# Patient Record
Sex: Female | Born: 1990 | Race: White | Hispanic: No | Marital: Single | Smoking: Former smoker
Health system: Southern US, Community
[De-identification: ages and names within clinical notes are randomized; demographics above are authoritative.]

## PROBLEM LIST (undated history)

## (undated) DIAGNOSIS — F909 Attention-deficit hyperactivity disorder, unspecified type: Secondary | ICD-10-CM

## (undated) DIAGNOSIS — R011 Cardiac murmur, unspecified: Secondary | ICD-10-CM

## (undated) DIAGNOSIS — D649 Anemia, unspecified: Secondary | ICD-10-CM

## (undated) DIAGNOSIS — F39 Unspecified mood [affective] disorder: Secondary | ICD-10-CM

## (undated) DIAGNOSIS — F329 Major depressive disorder, single episode, unspecified: Secondary | ICD-10-CM

## (undated) DIAGNOSIS — F419 Anxiety disorder, unspecified: Secondary | ICD-10-CM

## (undated) DIAGNOSIS — E079 Disorder of thyroid, unspecified: Secondary | ICD-10-CM

## (undated) DIAGNOSIS — J45909 Unspecified asthma, uncomplicated: Secondary | ICD-10-CM

## (undated) DIAGNOSIS — F32A Depression, unspecified: Secondary | ICD-10-CM

## (undated) DIAGNOSIS — T7840XA Allergy, unspecified, initial encounter: Secondary | ICD-10-CM

## (undated) HISTORY — DX: Allergy, unspecified, initial encounter: T78.40XA

## (undated) HISTORY — PX: GALLBLADDER SURGERY: SHX652

## (undated) HISTORY — DX: Unspecified mood (affective) disorder: F39

## (undated) HISTORY — DX: Unspecified asthma, uncomplicated: J45.909

## (undated) HISTORY — DX: Attention-deficit hyperactivity disorder, unspecified type: F90.9

## (undated) HISTORY — DX: Cardiac murmur, unspecified: R01.1

## (undated) HISTORY — DX: Disorder of thyroid, unspecified: E07.9

## (undated) HISTORY — DX: Anemia, unspecified: D64.9

## (undated) HISTORY — DX: Depression, unspecified: F32.A

## (undated) HISTORY — DX: Anxiety disorder, unspecified: F41.9

## (undated) HISTORY — DX: Major depressive disorder, single episode, unspecified: F32.9

---
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
## 2010-08-01 LAB — LIPASE, BLOOD: Lipase: 22 U/L (ref 11–59)

## 2010-08-01 LAB — DIFFERENTIAL
Basophils Relative: 1 % (ref 0–1)
Eosinophils Relative: 1 % (ref 0–5)
Lymphocytes Relative: 22 % (ref 12–46)
Lymphs Abs: 2.4 10*3/uL (ref 0.7–4.0)
Monocytes Absolute: 0.6 10*3/uL (ref 0.1–1.0)
Monocytes Relative: 5 % (ref 3–12)
Neutro Abs: 8.1 10*3/uL — ABNORMAL HIGH (ref 1.7–7.7)
Neutrophils Relative %: 71 % (ref 43–77)

## 2010-08-01 LAB — URINALYSIS, ROUTINE W REFLEX MICROSCOPIC
Bilirubin Urine: NEGATIVE
Hgb urine dipstick: NEGATIVE
Nitrite: NEGATIVE
Protein, ur: NEGATIVE mg/dL
Specific Gravity, Urine: 1.017 (ref 1.005–1.030)
Urobilinogen, UA: 0.2 mg/dL (ref 0.0–1.0)
pH: 6 (ref 5.0–8.0)

## 2010-08-01 LAB — CBC
Hemoglobin: 11.8 g/dL — ABNORMAL LOW (ref 12.0–15.0)
MCHC: 31.3 g/dL (ref 30.0–36.0)
WBC: 11.3 10*3/uL — ABNORMAL HIGH (ref 4.0–10.5)

## 2010-08-01 LAB — COMPREHENSIVE METABOLIC PANEL
BUN: 8 mg/dL (ref 6–23)
Calcium: 9.5 mg/dL (ref 8.4–10.5)
Chloride: 108 mEq/L (ref 96–112)
Creatinine, Ser: 0.64 mg/dL (ref 0.4–1.2)
GFR calc non Af Amer: >60 mL/min (ref >60)
Total Bilirubin: 0.4 mg/dL (ref 0.3–1.2)

## 2010-08-01 LAB — POCT PREGNANCY, URINE: Preg Test, Ur: NEGATIVE

## 2010-08-17 LAB — URINALYSIS, ROUTINE W REFLEX MICROSCOPIC
Ketones, ur: NEGATIVE mg/dL
Nitrite: NEGATIVE
Protein, ur: NEGATIVE mg/dL
Urobilinogen, UA: 0.2 mg/dL (ref 0.0–1.0)
pH: 6 (ref 5.0–8.0)

## 2010-08-17 LAB — COMPREHENSIVE METABOLIC PANEL
AST: 22 U/L (ref 0–37)
Albumin: 4 g/dL (ref 3.5–5.2)
CO2: 24 mEq/L (ref 19–32)
Calcium: 9.1 mg/dL (ref 8.4–10.5)
GFR calc non Af Amer: >60 mL/min (ref >60)
Sodium: 138 mEq/L (ref 135–145)
Total Bilirubin: 0.3 mg/dL (ref 0.3–1.2)
Total Protein: 7.6 g/dL (ref 6.0–8.3)

## 2010-08-17 LAB — DIFFERENTIAL
Basophils Absolute: 0.1 10*3/uL (ref 0.0–0.1)
Lymphs Abs: 4.5 10*3/uL — ABNORMAL HIGH (ref 0.7–4.0)
Monocytes Relative: 8 % (ref 3–12)
Neutro Abs: 5.8 10*3/uL (ref 1.7–7.7)
Neutrophils Relative %: 51 % (ref 43–77)

## 2010-08-17 LAB — LIPASE, BLOOD: Lipase: 27 U/L (ref 11–59)

## 2010-08-17 LAB — CBC
Hemoglobin: 12.8 g/dL (ref 12.0–15.0)
MCHC: 33.5 g/dL (ref 30.0–36.0)
MCV: 81.7 fL (ref 78.0–100.0)
Platelets: 356 10*3/uL (ref 150–400)
RBC: 4.69 MIL/uL (ref 3.87–5.11)
RDW: 14.7 % (ref 11.5–15.5)

## 2010-08-17 LAB — POCT PREGNANCY, URINE: Preg Test, Ur: NEGATIVE

## 2011-01-04 ENCOUNTER — Emergency Department (HOSPITAL_COMMUNITY)
Admission: EM | Admit: 2011-01-04 | Discharge: 2011-01-04 | Disposition: A | Discharge status: Home / Self Care | Payer: BC Managed Care – PPO | Attending: Emergency Medicine | Admitting: Emergency Medicine

## 2011-01-04 DIAGNOSIS — M25539 Pain in unspecified wrist: Secondary | ICD-10-CM | POA: Insufficient documentation

## 2011-01-04 DIAGNOSIS — M779 Enthesopathy, unspecified: Secondary | ICD-10-CM | POA: Insufficient documentation

## 2011-02-23 LAB — URINALYSIS, ROUTINE W REFLEX MICROSCOPIC
Glucose, UA: NEGATIVE
Ketones, ur: NEGATIVE
Protein, ur: NEGATIVE
Specific Gravity, Urine: 1.03

## 2011-02-23 LAB — CBC
MCHC: 34.1
MCV: 83.4
Platelets: 364 — ABNORMAL HIGH
RDW: 14.6 — ABNORMAL HIGH

## 2011-02-23 LAB — COMPREHENSIVE METABOLIC PANEL
AST: 16
Albumin: 3.2 — ABNORMAL LOW
Alkaline Phosphatase: 62
BUN: 7
Calcium: 7.7 — ABNORMAL LOW
Creatinine, Ser: 0.61
Glucose, Bld: 83
Potassium: 3.7
Total Protein: 6.3

## 2011-02-23 LAB — DIFFERENTIAL
Basophils Absolute: 0.1
Basophils Relative: 1
Eosinophils Absolute: 0.2
Eosinophils Relative: 2
Lymphocytes Relative: 16 — ABNORMAL LOW
Lymphs Abs: 1.6
Monocytes Absolute: 1.3 — ABNORMAL HIGH

## 2011-02-23 LAB — POCT PREGNANCY, URINE
Operator id: 29026
Preg Test, Ur: NEGATIVE

## 2012-06-03 ENCOUNTER — Ambulatory Visit (INDEPENDENT_AMBULATORY_CARE_PROVIDER_SITE_OTHER): Payer: BC Managed Care – PPO | Admitting: Family Medicine

## 2012-06-03 VITALS — BP 136/83 | HR 111 | Temp 98.0°F | Resp 18 | Ht 64.5 in | Wt 201.0 lb

## 2012-06-03 DIAGNOSIS — L0291 Cutaneous abscess, unspecified: Secondary | ICD-10-CM

## 2012-06-03 DIAGNOSIS — T300 Burn of unspecified body region, unspecified degree: Secondary | ICD-10-CM

## 2012-06-03 MED ORDER — DOXYCYCLINE HYCLATE 100 MG PO TABS: 100.0000 mg | ORAL_TABLET | Freq: Two times a day (BID) | ORAL | Status: DC

## 2012-06-03 MED ORDER — SILVER SULFADIAZINE 1 % EX CREA: TOPICAL_CREAM | Freq: Every day | CUTANEOUS | Status: DC

## 2012-06-03 NOTE — Progress Notes (Signed)
[facility]:  Office Visit  Chief Complaint:  Chief Complaint  Patient presents with  . burn arm    1 week ago  . abcess shoulder    right shoulder notice it about a week    HPI: Kim Lynch is a 22 y.o. female who complains of: 1. Right shoulder -pimple started about 1.5 weeks ago, looked like it was ready to pop and she squeezed it  and then 1 day after that got hard, painful. Tried  warm water during shower and green pus would come out and today it looks worse. Draining greenish yellow pus. 2. Burn at work, occurred 1 week ago. Works at [employer]. Burn herself on steamer for quesadillas. Using neosporin for both burn and abscess with dry dressing   Past Medical History  Diagnosis Date  . Allergy   . Asthma   . Depression   . Anemia   . Anxiety   . Heart murmur   . Mood disorder   . ADHD (attention deficit hyperactivity disorder)    Past Surgical History  Procedure Date  . Gallbladder surgery    History   Social History  . Marital Status: Single    Spouse Name: N/A    Number of Children: N/A  . Years of Education: N/A   Social History Main Topics  . Smoking status: Never Smoker   . Smokeless tobacco: None  . Alcohol Use: No  . Drug Use: None  . Sexually Active: None   Other Topics Concern  . None   Social History Narrative  . None   Family History  Problem Relation Age of Onset  . Acute myelogenous leukemia Mother   . Depression Father   . Bipolar disorder Father   . Anxiety disorder Brother   . Lupus Maternal Grandmother   . Stroke Maternal Grandfather   . Lupus Paternal Grandmother    No Known Allergies Prior to Admission medications   Medication Sig Start Date End Date Taking? Authorizing Provider  amphetamine-dextroamphetamine (ADDERALL XR) 30 MG 24 hr capsule Take 30 mg by mouth every morning.   Yes Historical Provider, MD  citalopram (CELEXA) 20 MG tablet Take 20 mg by mouth daily.   Yes Historical  Provider, MD  lamoTRIgine (LAMICTAL) 100 MG tablet Take 100 mg by mouth daily.   Yes Historical Provider, MD     ROS: The patient denies fevers, chills, night sweats, unintentional weight loss, chest pain, palpitations, wheezing, dyspnea on exertion, nausea, vomiting, abdominal pain, dysuria, hematuria, melena, numbness, weakness, or tingling.  All other systems have been reviewed and were otherwise negative with the exception of those mentioned in the HPI and as above.    PHYSICAL EXAM: Filed Vitals:   06/03/12 1852  BP: 136/83  Pulse: 111  Temp: 98 F (36.7 C)  Resp: 18   Filed Vitals:   06/03/12 1852  Height: 5' 4.5" (1.638 m)  Weight: 201 lb (91.173 kg)   Body mass index is 33.97 kg/(m^2).  General: Alert, no acute distress, obese white female, pleasant HEENT:  Normocephalic, atraumatic, oropharynx patent.  Cardiovascular:  Regular rate and rhythm, no rubs murmurs or gallops.  No Carotid bruits, radial pulse intact. No pedal edema.  Respiratory: Clear to auscultation bilaterally.  No wheezes, rales, or rhonchi.  No cyanosis, no use of accessory musculature GI: No organomegaly, abdomen is soft and non-tender, positive bowel sounds.  No masses. Skin: Right posterior shoulder: + 1/2 inch by 1/2 inch abscess, minimally  fluctuant, draining serosainguinous fluid. Min tenderness; Left upper forearm healing pink macular burn area about 3x2 inches Neurologic: Facial musculature symmetric. Psychiatric: Patient is appropriate throughout our interaction. Lymphatic: No cervical lymphadenopathy Musculoskeletal: Gait intact.   LABS:    EKG/XRAY:   Primary read interpreted by Dr. Conley Rolls at [facility].   ASSESSMENT/PLAN: Encounter Diagnoses  Name Primary?  . Cellulitis and abscess Yes  . Burn    Rx Doxycycline for right posterior shoulder abscess Rx  Silvadene for left arm burn Warm compresses to shoulder, C/w dry dressing F/u in 3-4 days if no improvement for I&D. Currently she would  prefer medications since she does not like getting shots.      Hamilton Capri PHUONG, DO 06/03/2012 7:34 PM

## 2012-08-02 ENCOUNTER — Ambulatory Visit (INDEPENDENT_AMBULATORY_CARE_PROVIDER_SITE_OTHER): Payer: BC Managed Care – PPO | Admitting: Family Medicine

## 2012-08-02 VITALS — BP 108/75 | HR 91 | Temp 98.2°F | Resp 16 | Ht 66.5 in | Wt 210.8 lb

## 2012-08-02 DIAGNOSIS — J019 Acute sinusitis, unspecified: Secondary | ICD-10-CM

## 2012-08-02 DIAGNOSIS — J209 Acute bronchitis, unspecified: Secondary | ICD-10-CM

## 2012-08-02 MED ORDER — AZITHROMYCIN 250 MG PO TABS: ORAL_TABLET | ORAL | Status: DC

## 2012-08-02 MED ORDER — METHYLPREDNISOLONE 4 MG PO KIT: PACK | ORAL | Status: DC

## 2012-08-02 NOTE — Progress Notes (Signed)
Patient ID: Kim Lynch MRN: [id number], DOB: [birth year], 22 y.o. Date of Encounter: 08/02/2012, 10:26 AM  Primary Physician: No primary provider on file.  Chief Complaint:  Chief Complaint  Patient presents with  . Cough  . Bodyaches  . hit forehead    hit on cupboard door    HPI: 22 y.o. year old female presents with a 30 day history of nasal congestion, post nasal drip, sore throat, and cough. Mild sinus pressure. Afebrile. No chills. Nasal congestion thick and green/yellow. Cough is productive of green/yellow sputum and not associated with time of day. Ears feel full, leading to sensation of muffled hearing. Has tried OTC cold preps without success. No GI complaints. Appetite poor  No sick contacts, recent antibiotics, or recent travels.   No leg trauma, sedentary periods, h/o cancer, or tobacco use.  Past Medical History  Diagnosis Date  . Allergy   . Asthma   . Depression   . Anemia   . Anxiety   . Heart murmur   . Mood disorder   . ADHD (attention deficit hyperactivity disorder)      Home Meds: Prior to Admission medications   Medication Sig Start Date End Date Taking? Authorizing Provider  amphetamine-dextroamphetamine (ADDERALL XR) 30 MG 24 hr capsule Take 30 mg by mouth every morning.   Yes Historical Provider, MD  citalopram (CELEXA) 20 MG tablet Take 20 mg by mouth daily.   Yes Historical Provider, MD  lamoTRIgine (LAMICTAL) 100 MG tablet Take 100 mg by mouth daily.   Yes Historical Provider, MD  loratadine (CLARITIN) 10 MG tablet Take 10 mg by mouth daily.   Yes Historical Provider, MD  doxycycline (VIBRA-TABS) 100 MG tablet Take 1 tablet (100 mg total) by mouth 2 (two) times daily. 06/03/12   Thao P Le, DO  silver sulfADIAZINE (SILVADENE) 1 % cream Apply topically daily. 06/03/12   Thao P Le, DO    Allergies: No Known Allergies  History   Social History  . Marital Status: Single    Spouse Name: N/A    Number of Children: N/A  . Years of  Education: N/A   Occupational History  . Not on file.   Social History Main Topics  . Smoking status: Former Games developer  . Smokeless tobacco: Not on file  . Alcohol Use: No  . Drug Use: Not on file  . Sexually Active: Not on file   Other Topics Concern  . Not on file   Social History Narrative  . No narrative on file     Review of Systems: Constitutional: negative for chills, fever, night sweats or weight changes Cardiovascular: negative for chest pain or palpitations Respiratory: negative for hemoptysis, wheezing, or shortness of breath Abdominal: negative for abdominal pain, nausea, vomiting or diarrhea Dermatological: negative for rash Neurologic: negative for headache   Physical Exam: Blood pressure 108/75, pulse 91, temperature 98.2 F (36.8 C), temperature source Oral, resp. rate 16, height 5' 6.5" (1.689 m), weight 210 lb 12.8 oz (95.618 kg), last menstrual period 07/06/2012, SpO2 99.00%., Body mass index is 33.52 kg/(m^2). General: Well developed, well nourished, in no acute distress. Head: Normocephalic, atraumatic, eyes without discharge, sclera non-icteric, nares are congested. Bilateral auditory canals clear, TM's are without perforation, red and slightly bulging on right. No sinus TTP. Oral cavity moist, dentition normal. Posterior pharynx with post nasal drip and mild erythema. No peritonsillar abscess or tonsillar exudate. Neck: Supple. No thyromegaly. Full ROM. No lymphadenopathy. Lungs: Bilateral wheezes and inspiratory rales Heart:  RRR with S1 S2. No murmurs, rubs, or gallops appreciated. Msk:  Strength and tone normal for age. Extremities: No clubbing or cyanosis. No edema. Neuro: Alert and oriented X 3. Moves all extremities spontaneously. CNII-XII grossly in tact. Psych:  Responds to questions appropriately with a normal affect.    ASSESSMENT AND PLAN:  22 y.o. year old female with bronchitis and sinusitis  - -Mucinex -Tylenol/Motrin  prn -Rest/fluids -RTC precautions -RTC 3-5 days if no improvement  Signed, Elvina Sidle, MD 08/02/2012 10:26 AM

## 2012-08-02 NOTE — Patient Instructions (Signed)

## 2014-08-09 ENCOUNTER — Ambulatory Visit (INDEPENDENT_AMBULATORY_CARE_PROVIDER_SITE_OTHER): Payer: BC Managed Care – PPO | Admitting: Internal Medicine

## 2014-08-09 VITALS — BP 124/70 | HR 85 | Temp 99.3°F | Resp 18 | Ht 68.0 in | Wt 217.6 lb

## 2014-08-09 DIAGNOSIS — R112 Nausea with vomiting, unspecified: Secondary | ICD-10-CM | POA: Diagnosis not present

## 2014-08-09 MED ORDER — ONDANSETRON 4 MG PO TBDP
8.0000 mg | ORAL_TABLET | Freq: Once | ORAL | Status: AC
  Administered 2014-08-09: 8 mg via ORAL

## 2014-08-09 MED ORDER — ONDANSETRON HCL 4 MG PO TABS: 4.0000 mg | ORAL_TABLET | Freq: Three times a day (TID) | ORAL | Status: DC | PRN

## 2014-08-09 NOTE — Progress Notes (Signed)
This chart was scribed for Kim Sia, MD by Kim Lynch, Medical Scribe. This patient was seen in room 5 and the patient's care was started at 4:50 PM.  Subjective:    Patient ID: Kim Lynch, female    DOB: November 28, 1990, 24 y.o.   MRN: [id number]  Chief Complaint  Patient presents with  . Emesis  . Diarrhea    HPI Kim Lynch is a 24 y.o. female who presents to the office complaining of several episodes of emesis that started 4 days ago. Last episode of emesis was this morning at 2AM. She is also complaining of associated diarrhea, nausea, abdominal cramping, chills, and generalized myalgias. Pt reports a h/o cholecystectomy. Last episode of diarrhea was an hour prior to arrival. Pt denies fever, sore throat, visual disturbance, CP, cough, SOB, urinary symptoms, or HA as associated symptoms.  There are no active problems to display for this patient.  Past Medical History  Diagnosis Date  . Allergy   . Asthma   . Depression   . Anemia   . Anxiety   . Heart murmur   . Mood disorder   . ADHD (attention deficit hyperactivity disorder)    Past Surgical History  Procedure Laterality Date  . Gallbladder surgery     No Known Allergies Prior to Admission medications   Medication Sig Start Date End Date Taking? Authorizing Provider  amphetamine-dextroamphetamine (ADDERALL XR) 30 MG 24 hr capsule Take 30 mg by mouth every morning.   Yes Historical Provider, MD  citalopram (CELEXA) 20 MG tablet Take 20 mg by mouth daily.   Yes Historical Provider, MD  lamoTRIgine (LAMICTAL) 100 MG tablet Take 100 mg by mouth daily.   Yes Historical Provider, MD  azithromycin (ZITHROMAX Z-PAK) 250 MG tablet Take as directed on pack Patient not taking: Reported on 08/09/2014 08/02/12   Elvina Sidle, MD  loratadine (CLARITIN) 10 MG tablet Take 10 mg by mouth daily.    Historical Provider, MD  methylPREDNISolone (MEDROL, PAK,) 4 MG tablet follow package directions Patient not taking:  Reported on 08/09/2014 08/02/12   Elvina Sidle, MD  silver sulfADIAZINE (SILVADENE) 1 % cream Apply topically daily. Patient not taking: Reported on 08/09/2014 06/03/12   Lenell Antu, DO   History   Social History  . Marital Status: Single    Spouse Name: N/A  . Number of Children: N/A  . Years of Education: N/A   Occupational History  . Not on file.   Social History Main Topics  . Smoking status: Former Games developer  . Smokeless tobacco: Not on file  . Alcohol Use: No  . Drug Use: Not on file  . Sexual Activity: Not on file   Other Topics Concern  . Not on file   Social History Narrative     Review of Systems  Constitutional: Positive for chills. Negative for fever.  Respiratory: Negative for cough and shortness of breath.   Cardiovascular: Negative for chest pain.  Gastrointestinal: Positive for nausea, vomiting, abdominal pain and diarrhea. Negative for blood in stool.  Musculoskeletal: Positive for myalgias.  Neurological: Negative for dizziness, light-headedness and headaches.      BP 124/70 mmHg  Pulse 85  Temp(Src) 99.3 F (37.4 C) (Oral)  Resp 18  Ht  (1.727 m)  Wt 217 lb 9.6 oz (98.703 kg)  BMI 33.09 kg/m2  Objective:   Physical Exam  Constitutional: She appears well-developed and well-nourished. No distress.  HENT:  Head: Normocephalic and atraumatic.  Right Ear: External ear  normal.  Left Ear: External ear normal.  Eyes: Conjunctivae and EOM are normal. Pupils are equal, round, and reactive to light. Right eye exhibits no discharge. Left eye exhibits no discharge.  Neck: Neck supple.  Cardiovascular: Normal rate, regular rhythm and normal heart sounds.  Exam reveals no gallop and no friction rub.   No murmur heard. Pulmonary/Chest: Effort normal and breath sounds normal. No respiratory distress.  Abdominal: Soft. Bowel sounds are normal. She exhibits no distension. There is no tenderness.  Muscle wall is tender but there is no organomegaly, no  masses, or rebound.   Musculoskeletal: She exhibits no edema or tenderness.  Neurological: She is alert.  Skin: Skin is warm and dry.  Psychiatric: She has a normal mood and affect. Her behavior is normal. Thought content normal.  Nursing note and vitals reviewed.   Assessment & Plan:  Non-intractable vomiting with nausea, vomiting of unspecified type - Plan: ondansetron (ZOFRAN-ODT) disintegrating tablet 8 mg  gastroenteritis suspected Meds ordered this encounter  Medications  . ondansetron (ZOFRAN-ODT) disintegrating tablet 8 mg given in clinic    Sig:   . ondansetron (ZOFRAN) 4 MG tablet    Sig: Take 1 tablet (4 mg total) by mouth every 8 (eight) hours as needed for nausea or vomiting.    Dispense:  10 tablet    Refill:  0   one fourth class of ice chips and liquid every 15 minutes for 2 hours and then advance diet as tolerated   I have completed the patient encounter in its entirety as documented by the scribe, with editing by me where necessary. Kim Lynch, M.D.

## 2015-08-17 ENCOUNTER — Ambulatory Visit: Payer: BC Managed Care – PPO | Admitting: Internal Medicine

## 2015-08-17 DIAGNOSIS — Z0289 Encounter for other administrative examinations: Secondary | ICD-10-CM

## 2016-10-22 ENCOUNTER — Encounter: Payer: Self-pay | Admitting: Nurse Practitioner

## 2016-10-22 ENCOUNTER — Ambulatory Visit (INDEPENDENT_AMBULATORY_CARE_PROVIDER_SITE_OTHER): Payer: Self-pay | Admitting: Nurse Practitioner

## 2016-10-22 VITALS — BP 116/70 | HR 69 | Temp 99.4°F | Wt 243.2 lb

## 2016-10-22 DIAGNOSIS — J069 Acute upper respiratory infection, unspecified: Secondary | ICD-10-CM

## 2016-10-22 DIAGNOSIS — M791 Myalgia, unspecified site: Secondary | ICD-10-CM

## 2016-10-22 LAB — POC INFLUENZA A&B (BINAX/QUICKVUE)
INFLUENZA A, POC: NEGATIVE
Influenza B, POC: NEGATIVE

## 2016-10-22 MED ORDER — AZITHROMYCIN 250 MG PO TABS: ORAL_TABLET | ORAL | 0 refills | Status: AC

## 2016-10-22 MED ORDER — BENZONATATE 100 MG PO CAPS: 100.0000 mg | ORAL_CAPSULE | Freq: Two times a day (BID) | ORAL | 0 refills | Status: AC

## 2016-10-22 NOTE — Addendum Note (Signed)
Addended by: Violeta GelinasWYRICK, Sokha Craker D on: 10/22/2016 11:26 AM   Modules accepted: Orders

## 2016-10-22 NOTE — Patient Instructions (Addendum)
Upper Respiratory Infection, Adult Most upper respiratory infections (URIs) are a viral infection of the air passages leading to the lungs. A URI affects the nose, throat, and upper air passages. The most common type of URI is nasopharyngitis and is typically referred to as "the common cold." URIs run their course and usually go away on their own. Most of the time, a URI does not require medical attention, but sometimes a bacterial infection in the upper airways can follow a viral infection. This is called a secondary infection. Sinus and middle ear infections are common types of secondary upper respiratory infections. Bacterial pneumonia can also complicate a URI. A URI can worsen asthma and chronic obstructive pulmonary disease (COPD). Sometimes, these complications can require emergency medical care and may be life threatening. What are the causes? Almost all URIs are caused by viruses. A virus is a type of germ and can spread from one person to another. What increases the risk? You may be at risk for a URI if:  You smoke.  You have chronic heart or lung disease.  You have a weakened defense (immune) system.  You are very young or very old.  You have nasal allergies or asthma.  You work in crowded or poorly ventilated areas.  You work in health care facilities or schools.  What are the signs or symptoms? Symptoms typically develop 2-3 days after you come in contact with a cold virus. Most viral URIs last 7-10 days. However, viral URIs from the influenza virus (flu virus) can last 14-18 days and are typically more severe. Symptoms may include:  Runny or stuffy (congested) nose.  Sneezing.  Cough.  Sore throat.  Headache.  Fatigue.  Fever.  Loss of appetite.  Pain in your forehead, behind your eyes, and over your cheekbones (sinus pain).  Muscle aches.  How is this diagnosed? Your health care provider may diagnose a URI by:  Physical exam.  Tests to check that your  symptoms are not due to another condition such as: ? Strep throat. ? Sinusitis. ? Pneumonia. ? Asthma.  How is this treated? A URI goes away on its own with time. It cannot be cured with medicines, but medicines may be prescribed or recommended to relieve symptoms. Medicines may help:  Reduce your fever.  Reduce your cough.  Relieve nasal congestion.  Follow these instructions at home:  Take medicines only as directed by your health care provider.  Gargle warm saltwater or take cough drops to comfort your throat as directed by your health care provider.  Use a warm mist humidifier or inhale steam from a shower to increase air moisture. This may make it easier to breathe.  Drink enough fluid to keep your urine clear or pale yellow.  Eat soups and other clear broths and maintain good nutrition.  Rest as needed.  Return to work when your temperature has returned to normal or as your health care provider advises. You may need to stay home longer to avoid infecting others. You can also use a face mask and careful hand washing to prevent spread of the virus.  Increase the usage of your inhaler if you have asthma.  Do not use any tobacco products, including cigarettes, chewing tobacco, or electronic cigarettes. If you need help quitting, ask your health care provider. How is this prevented? The best way to protect yourself from getting a cold is to practice good hygiene.  Avoid oral or hand contact with people with cold symptoms.  Wash your   hands often if contact occurs.  There is no clear evidence that vitamin C, vitamin E, echinacea, or exercise reduces the chance of developing a cold. However, it is always recommended to get plenty of rest, exercise, and practice good nutrition. Contact a health care provider if:  You are getting worse rather than better.  Your symptoms are not controlled by medicine.  You have chills.  You have worsening shortness of breath.  You have  brown or red mucus.  You have yellow or brown nasal discharge.  You have pain in your face, especially when you bend forward.  You have a fever.  You have swollen neck glands.  You have pain while swallowing.  You have white areas in the back of your throat. Get help right away if:  You have severe or persistent: ? Headache. ? Ear pain. ? Sinus pain. ? Chest pain.  You have chronic lung disease and any of the following: ? Wheezing. ? Prolonged cough. ? Coughing up blood. ? A change in your usual mucus.  You have a stiff neck.  You have changes in your: ? Vision. ? Hearing. ? Thinking. ? Mood. This information is not intended to replace advice given to you by your health care provider. Make sure you discuss any questions you have with your health care provider. Document Released: 10/24/2000 Document Revised: 01/01/2016 Document Reviewed: 08/05/2013 Elsevier Interactive Patient Education  2017 [employer] Inc[employer]  

## 2016-10-22 NOTE — Progress Notes (Signed)
Subjective:     Kim Lynch is a 26 y.o. female who presents for evaluation of fever. She has had the fever for 3 days. Symptoms have been gradually worsening. Symptoms are described as suspected fevers but not measured at home, and are worse in the morning and at nighttime. Associated symptoms are poor appetite and sneezing and some mild fatigue.  Patient also coughing x 1 week, but worsening over the past 3 days.  Cough is productive with greenish sputum. . Patient denies body aches, diarrhea and vomiting.  She has tried to alleviate the symptoms with has not taken any medications for her symptoms. with no relief. 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  Patient has a history of bronchitis.  The following portions of the patient's history were reviewed and updated as appropriate: allergies, current medications and past medical history.  Review of Systems Constitutional: positive for anorexia, chills, fatigue and fevers Eyes: positive for itching Ears, nose, mouth, throat, and face: positive for nasal congestion and sore throat, negative for ear drainage and earaches Respiratory: positive for cough Cardiovascular: negative Allergic/Immunologic: positive for hay fever   Objective:    BP 116/70 (BP Location: Right Arm, Patient Position: Sitting, Cuff Size: Normal)   Pulse 69   Temp 99.4 F (37.4 C) (Oral)   Wt 243 lb 3.2 oz (110.3 kg)   SpO2 100%   BMI 36.98 kg/m  General appearance: alert, cooperative and no distress Head: Normocephalic, without obvious abnormality, atraumatic Eyes: conjunctivae/corneas clear. PERRL, EOM's intact. Fundi benign. Ears: normal TM's and external ear canals both ears Nose: mild maxillary sinus tenderness bilateral Throat: mild tonsillar erythema bilaterally, mild edema bilaterally Lungs: clear to auscultation bilaterally Heart: regular rate and rhythm, S1, S2 normal, no murmur, click, rub or gallop Neurologic:  Grossly normal   Assessment:    Fever is likely secondary to Acute Upper Respiratory Infection.   Plan:    Antibiotics as per orders. Ibuprofen or Tylenol for pain, fever, or general discomfort. Tessalon Perles, Throat lozenges or cough drops for the cough.  Go to ER if increasing fever, worsening cough, SOB or difficulty breathing. Follow up as needed.

## 2016-10-25 ENCOUNTER — Telehealth: Payer: Self-pay | Admitting: Nurse Practitioner

## 2016-10-25 NOTE — Telephone Encounter (Signed)
Called patient to follow up on her status.  Reached voicemail, left message to return my call.

## 2017-10-30 ENCOUNTER — Other Ambulatory Visit: Payer: Self-pay

## 2017-10-30 ENCOUNTER — Encounter: Payer: Self-pay | Admitting: Physician Assistant

## 2017-10-30 ENCOUNTER — Ambulatory Visit: Payer: Self-pay | Admitting: Physician Assistant

## 2017-10-30 VITALS — BP 108/70 | HR 84 | Temp 98.6°F | Resp 16 | Ht 65.0 in | Wt 242.6 lb

## 2017-10-30 DIAGNOSIS — E039 Hypothyroidism, unspecified: Secondary | ICD-10-CM | POA: Insufficient documentation

## 2017-10-30 DIAGNOSIS — Z8639 Personal history of other endocrine, nutritional and metabolic disease: Secondary | ICD-10-CM

## 2017-10-30 DIAGNOSIS — F32A Depression, unspecified: Secondary | ICD-10-CM | POA: Insufficient documentation

## 2017-10-30 DIAGNOSIS — Z6841 Body Mass Index (BMI) 40.0 and over, adult: Secondary | ICD-10-CM | POA: Insufficient documentation

## 2017-10-30 DIAGNOSIS — F329 Major depressive disorder, single episode, unspecified: Secondary | ICD-10-CM

## 2017-10-30 DIAGNOSIS — E66813 Obesity, class 3: Secondary | ICD-10-CM | POA: Insufficient documentation

## 2017-10-30 MED ORDER — LEVOTHYROXINE SODIUM 50 MCG PO TABS: 50.0000 ug | ORAL_TABLET | Freq: Every day | ORAL | 11 refills | Status: DC

## 2017-10-30 MED ORDER — CITALOPRAM HYDROBROMIDE 40 MG PO TABS: 40.0000 mg | ORAL_TABLET | Freq: Every day | ORAL | 5 refills | Status: DC

## 2017-10-30 NOTE — Patient Instructions (Addendum)
I will contact you with the results of today's labs when they come back and let you know what the follow-up plan will be.  Please read below for tips about maintaining a healthy lifestyle.   These are some of my general health and wellness recommendations. Some of them may apply to you better than others. Please use common sense as you try these suggestions and feel free to ask me any questions.  ACTIVITY/FITNESS Mental, social, emotional and physical stimulation are very important for brain and body health. Try learning a new activity (arts, music, language, sports, games).  Keep moving your body to the best of your abilities. You can do this at home, inside or outside, the park, community center, gym or anywhere you like. Consider a physical therapist or personal trainer to get started. Consider the app Sworkit. Fitness trackers such as smart-watches, smart-phones or Fitbits can help as well.   NUTRITION Eat more plants: colorful vegetables, nuts, seeds and berries. Eat less sugar, salt, preservatives and processed foods. Avoid toxins such as cigarettes and alcohol. Drink water when you are thirsty. Warm water with a slice of lemon is an excellent morning drink to start the day. Consider these websites for more information The Nutrition Source (https://www.henry-hernandez.biz/) Precision Nutrition (WindowBlog.ch)   RELAXATION Consider practicing mindfulness meditation or other relaxation techniques such as deep breathing, prayer, yoga, tai chi, massage. See website mindful.org or the apps Headspace or Calm to help get started.   SLEEP Try to get at least 7-8+ hours sleep per day. Regular exercise and reduced caffeine will help you sleep better. Practice good sleep hygeine techniques. See website sleep.org for more information.   Try to shop mostly along the perimeter of the grocery store. Cut down consumption of processed foods.    The following foods are the foundation of a heart-healthy diet: Vegetables such as greens (spinach, collard greens, kale), broccoli, cabbage, carrots, bell peppers; stay away from starchy vegetables like potatoes, carrots, peas Fruits such as avocados, apples, berries, bananas, oranges, pears, grapes, and prunes  Whole grains such as plain oatmeal, brown rice, and whole-grain bread or tortillas  Fat-free or low-fat dairy foods such as milk, cheese, or yogurt  Protein-rich foods:  Fish high in omega-3 fatty acids, such as salmon, tuna, and trout, about 8 ounces a week  Lean meats such as 95 percent lean ground beef or pork tenderloin  Poultry such as skinless chicken or [place]  Eggs  Nuts, seeds, and soy products: quinoa, chia seeds Legumes such as kidney beans, lentils, chickpeas, black-eyed peas, and lima beans Oils and foods containing high levels of monounsaturated and polyunsaturated fats that can help lower blood cholesterol levels and the risk of cardiovascular disease. Some sources of these oils are:  Canola, corn, olive, safflower, sesame, sunflower, and soybean oils  Nuts such as walnuts, almonds, and pine nuts  Nut and seed butters  Salmon and trout  Seeds such as sesame, sunflower, pumpkin, or flax  Avocados  Tofu  Brussel sprouts - Cut off stems. Place in a mixing bowl that has a lid. Pour in a 1/4-1/2 cup olive oil, spices, use a light amount of parmesan. Place on a baking sheet. Bake for 10 minutes at 400F. Take it out, eat the brussel chips. Place for another 5-10 minutes.   Mashed cauliflower - Boil a bunch of cauliflower in a pot of water. Blend in a food processor with 1-2 tablespoons of butter.  Spaghetti squash -  Cut the squash in half  very carefully, clean out seeds from the middle. Place 1/2 face down in a microwave safe dish with at least 2 inches of water. Make 4-6 slits on outside of spaghetti squash and microwave for 10-12 minutes. Take out the spaghetti using  a metal spoon. Repeat for the other half.   Vega protein is good protein powder, make sure you use ~6 ice cubes to give it smoothie consistency together with ~4-6 ounces of vanilla soy milk. Throw cinnamon into your shake, use peanut butter. You can also use the fruits listed above. Throw spinach or kale into the shake.   Recipe ideas: [employer], [employer], Lung, and Deportcom, wholefoodsmarket.com  Limit added sugars When you follow a heart-healthy eating plan, you should limit the amount of calories you consume each day from added sugars. Because added sugars do not provide essential nutrients and are extra calories, limiting them can help you choose nutrient-rich foods and stay within your daily calorie limit. Some foods, such as fruit, contain natural sugars. Added sugars do not occur naturally in foods, but instead are used to sweeten foods and drinks. Some examples of added sugars include brown sugar, corn syrup, dextrose, fructose, glucose, high-fructose corn syrup, raw sugar, and sucrose. In the [place], sweetened drinks, snacks, and sweets are the major sources of added sugars. Sweetened drinks account for about half of all added sugars consumed. The following are examples of foods and drinks with added sugars. Sweetened drinks include soft drinks or sodas, fruit drinks, sweetened coffee and tea, energy drinks, alcoholic drinks, and favored waters.  Snacks and sweets include grain-based desserts such as cakes, pies, cookies, brownies, doughnuts; dairy desserts such as ice cream, frozen desserts, and pudding; candies; sugars; jams; syrups; and sweet toppings. To help you reduce the amount of added sugars in your diet: Choose unsweetened or whole fruits for snacks or dessert.  Choose drinks without added sugar such as water, low-fat or fat-free milk, or 100 percent fruit or vegetable juice.  Limit intake of sweetened drinks, snacks and desserts by  eating them less often and in smaller amounts.  If you drink alcohol, you should limit your intake. Men should have no more than two alcoholic drinks per day. Women should have no more than one alcoholic drink per day. One drink is: 12 ounces of regular beer (5 percent alcohol)  5 ounces of wine (12 percent alcohol)  1 ounces of 80-proof liquor (40 percent alcohol)   Thank you for coming in today. I hope you feel we met your needs.  Feel free to call PCP if you have any questions or further requests.  Please consider signing up for MyChart if you do not already have it, as this is a great way to communicate with me.  Best,  Whitney McVey, PA-C  IF you received an x-ray today, you will receive an invoice from [facility]. Please contact [facility] at [phone] with questions or concerns regarding your invoice.   IF you received labwork today, you will receive an invoice from [facility]. Please contact [facility] at [phone] with questions or concerns regarding your invoice.   Our billing staff will not be able to assist you with questions regarding bills from these companies.  You will be contacted with the lab results as soon as they are available. The fastest way to get your results is to activate your My Chart account. Instructions are located on the last page of this paperwork. If you have not heard  from [place] regarding the results in 2 weeks, please contact this office.

## 2017-10-30 NOTE — Progress Notes (Signed)
Kim Lynch  MRN: [id number] DOB: 03/05/1991  PCP: Patient, No Pcp Per  Subjective:  Pt is a pleasant 27 year old female PMH hypothyroidism, ADD, mood disorder and depression who presents to clinic to establish care.  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  Has been using [facility] bc she has not had insurance.  Recently signed up at [employer] with her boyfriend.   Thyroid disorder - Recently increased Synthroid from 25mg  to 50 mg about 1 month ago. She was off medication for 1 year bc she ran out of medications. "I sucked at adulting for a while".  Labs 07/2017: TSH 14.7, T4, free 0.82.    Depression - Celexa 40mg . "I'm just blah feeling". "Mentally I'm good" she feels like she wants to do stuff, but her body doesn't want to get on the same page.  Denies HI or SI.   Diet: eats three meals/day. She avoids processed foods. Tries to cook at home more than she goes out.   [facility] on  06/06/2016: "Cholesterol is good, no DM, vit D is low, start 2000 iu vitamin D daily, start 25 mcg levothyroxine I will send to pharmacy"  - Pt is not taking Vitamin D.   [facility]Assessment [facility] 06/06/2016:  "Would typically not treat a patient with history of mood disorder with only SSRI without mood stabilizer however she was on lamictal for mood stablization in the past and was taken off and has been doing well on only celexa for a long period of time. I will keep her on the celexa however we discussed that she may need psychiatric referral for any mood stabilization in the future. No suicidal ideation."    Review of Systems  Constitutional: Positive for fatigue. Negative for diaphoresis.  Endocrine: Negative for cold intolerance and heat intolerance.  Psychiatric/Behavioral: Positive for dysphoric mood. Negative for self-injury and suicidal ideas.  The patient is not nervous/anxious.     There are no active problems to display for this patient.   Current Outpatient Medications on File Prior to Visit  Medication Sig Dispense Refill  . citalopram (CELEXA) 20 MG tablet Take 20 mg by mouth daily.    Marland Kitchen. levothyroxine (SYNTHROID, LEVOTHROID) 25 MCG tablet Take 1 tablet by mouth daily.     No current facility-administered medications on file prior to visit.     No Known Allergies   Objective:  BP 108/70 (BP Location: Left Arm, Patient Position: Sitting, Cuff Size: Large)   Pulse 84   Temp 98.6 F (37 C) (Oral)   Resp 16   Ht 5\' 5"  (1.651 m)   Wt 242 lb 9.6 oz (110 kg)   LMP 10/10/2017   SpO2 99%   BMI 40.37 kg/m   Physical Exam  Constitutional: She is oriented to person, place, and time. No distress.  Obese  Neck: Normal range of motion. Neck supple. No thyromegaly present.  Cardiovascular: Normal rate, regular rhythm and normal heart sounds.  Neurological: She is alert and oriented to person, place, and time.  Skin: Skin is warm and dry.  Psychiatric: Judgment normal.  Vitals reviewed.   Assessment and Plan :  1. Hypothyroidism, unspecified type - pt presents for management of Synthroid. Recently been seeing [facility] - increased Synthroid to 50mcg one month ago. Plan to check thyroid panel and will make medication adjustments as needed.  Will contact with plan.  - Thyroid Panel With TSH - levothyroxine (SYNTHROID, LEVOTHROID) 50 MCG tablet; Take 1 tablet (50 mcg total) by mouth daily.  Dispense: 30 tablet; Refill: 11  2. Depression, unspecified depression type - Mostly controlled with Cleexa 40mg . She is trying to change lifestyle including improving diet and starting to exercise. She lives with boyfriend who sounds like a great support system. OK to refill this dose.  - citalopram (CELEXA) 40 MG tablet; Take 1 tablet (40 mg total) by mouth daily.  Dispense: 30 tablet; Refill: 5  3. History of vitamin D  deficiency - Recheck levels - will contact with results.  - VITAMIN D 25 Hydroxy (Vit-D Deficiency, Fractures)  4. Class 3 severe obesity with body mass index (BMI) of 40.0 to 44.9 in adult, unspecified obesity type, unspecified whether serious comorbidity present (HCC) - Discussed need to cont to improve lifestyle. No weight loss goal set today.    Marco Collie, PA-C  Primary Care at [facility] 10/30/2017 9:23 AM

## 2017-10-31 ENCOUNTER — Encounter: Payer: Self-pay | Admitting: Physician Assistant

## 2017-10-31 DIAGNOSIS — E039 Hypothyroidism, unspecified: Secondary | ICD-10-CM

## 2017-10-31 LAB — THYROID PANEL WITH TSH
Free Thyroxine Index: 1.7 (ref 1.2–4.9)
T3 Uptake Ratio: 24 % (ref 24–39)
T4, Total: 7 ug/dL (ref 4.5–12.0)
TSH: 15.3 u[IU]/mL — ABNORMAL HIGH (ref 0.450–4.500)

## 2017-10-31 LAB — VITAMIN D 25 HYDROXY (VIT D DEFICIENCY, FRACTURES): Vit D, 25-Hydroxy: 27.1 ng/mL — ABNORMAL LOW (ref 30.0–100.0)

## 2017-10-31 MED ORDER — LEVOTHYROXINE SODIUM 100 MCG PO TABS: 100.0000 ug | ORAL_TABLET | Freq: Every day | ORAL | 3 refills | Status: DC

## 2017-10-31 NOTE — Progress Notes (Signed)
TSH elevated. Increase synthroid to . RTC lab only visit in 6 weeks for recheck

## 2017-10-31 NOTE — Addendum Note (Signed)
Addended by: Sebastian AcheMCVEY, Francisco Eyerly WHITNEY on: 10/31/2017 01:21 PM   Modules accepted: Orders

## 2017-12-11 ENCOUNTER — Ambulatory Visit: Payer: Self-pay | Admitting: Physician Assistant

## 2017-12-11 ENCOUNTER — Encounter: Payer: Self-pay | Admitting: Physician Assistant

## 2017-12-11 VITALS — BP 110/62 | HR 71 | Temp 97.9°F | Resp 16 | Ht 65.0 in | Wt 229.0 lb

## 2017-12-11 DIAGNOSIS — F329 Major depressive disorder, single episode, unspecified: Secondary | ICD-10-CM

## 2017-12-11 DIAGNOSIS — F32A Depression, unspecified: Secondary | ICD-10-CM

## 2017-12-11 DIAGNOSIS — N926 Irregular menstruation, unspecified: Secondary | ICD-10-CM

## 2017-12-11 DIAGNOSIS — E079 Disorder of thyroid, unspecified: Secondary | ICD-10-CM

## 2017-12-11 LAB — POCT URINE PREGNANCY: Preg Test, Ur: NEGATIVE

## 2017-12-11 MED ORDER — MIRTAZAPINE 15 MG PO TABS: 15.0000 mg | ORAL_TABLET | Freq: Every day | ORAL | 2 refills | Status: DC

## 2017-12-11 NOTE — Patient Instructions (Addendum)
Remeron: 15 mg once daily at bedtime; increase dose to 78m after 3 weeks if you need.  Continue Celexa Come back and see me in 4-6 weeks to recheck.  I will contact you about lab results.   For therapy -- [facility] ([place] - [phone]([facility] - 3[place] [facility] - [phone] - 3[place]- [phone]  - [phone](do not file insurance)  Thank you for coming in today. I hope you feel we met your needs.  Feel free to call PCP if you have any questions or further requests.  Please consider signing up for MyChart if you do not already have it, as this is a great way to communicate with me.  Best,  Whitney McVey, PA-C   IF you received an x-ray today, you will [facility] an [facility]. Please contact [facility] at [phone] questions or concerns regarding your invoice.   IF you received labwork today, you will receive an invoice from [place] Please contact [facility] at [phone] questions or concerns regarding your invoice.   Our billing staff will not be able to assist you with questions regarding bills from these companies.  You will be contacted with the lab results as soon as they are available. The fastest way to get your results is to activate your My Chart account. Instructions are located on the last page of this paperwork. If you have not heard from [place] the results in 2 weeks, please contact this office.

## 2017-12-11 NOTE — Progress Notes (Signed)
Kim Lynch  MRN: [id number] DOB: 1990/08/28  PCP: Patient, No Pcp Per  Subjective:  Pt is a pleasant 27 year old femalePMH hypothyroidism, ADD, mood disorder and depression who presents to clinic for depression and thyroid.   Depression - "I feel like thyroid med is beating up on the Celexa. I feel like I'am in a mental "haze" like auto pilot." Celexa 40mg . Recent break-up with her boyfriend.  She is living at home with her father with whom she has a great relationship.  She is open with her dad and her brother about the state of her depression. Celexa is helping with her anxiety however she feels like it is not helping depression. Denies HI or SI.  Decreased appetite. She is exercising regularly.  Has tried Lamictal before - didn't like the way it made her feel. Is not seeing a counselor due to financial reasons .  She is looking into counseling and will hopefully make an appointment in the next few weeks.  Hypothyroid - Increased synthroid to 100mcg after last labs 6/19 with TSH 15.3. She did not return for repeat labs. She does not miss doses.    Review of Systems  Constitutional: Positive for appetite change (Decreased).  Gastrointestinal: Negative for abdominal pain, diarrhea, nausea and vomiting.  Psychiatric/Behavioral: Positive for dysphoric mood. Negative for self-injury, sleep disturbance and suicidal ideas. The patient is nervous/anxious.     Patient Active Problem List   Diagnosis Date Noted  . Class 3 severe obesity with body mass index (BMI) of 40.0 to 44.9 in adult (HCC) 10/30/2017  . Depression 10/30/2017  . Hypothyroidism 10/30/2017    Current Outpatient Medications on File Prior to Visit  Medication Sig Dispense Refill  . citalopram (CELEXA) 40 MG tablet Take 1 tablet (40 mg total) by mouth daily. 30 tablet 5  . levothyroxine (SYNTHROID, LEVOTHROID) 100 MCG tablet Take 1 tablet (100 mcg total) by mouth daily. 90 tablet 3   No current  facility-administered medications on file prior to visit.     No Known Allergies   Objective:  BP 110/62 (BP Location: Left Arm, Patient Position: Sitting, Cuff Size: Large)   Pulse 71   Temp 97.9 F (36.6 C) (Oral)   Resp 16   Ht 5\' 5"  (1.651 m)   Wt 229 lb (103.9 kg)   LMP 11/29/2017   SpO2 96%   BMI 38.11 kg/m   Physical Exam  Constitutional: She is oriented to person, place, and time. No distress.  Cardiovascular: Normal rate, regular rhythm and normal heart sounds.  Neurological: She is alert and oriented to person, place, and time.  Skin: Skin is warm and dry.  Psychiatric: Judgment normal.  Vitals reviewed.  Results for orders placed or performed in visit on 12/11/17  POCT urine pregnancy  Result Value Ref Range   Preg Test, Ur Negative Negative    Assessment and Plan :  1. Depression, unspecified depression type -Patient presents for follow-up depression.  Denies SI or HI.  She feels like her Celexa is not helping very much.  Plan to add Remeron 15 mg with instruction to increase to 30 mg after 3 weeks if needed.  Information for therapists provided, she plans to make appointment in the next few weeks. - mirtazapine (REMERON) 15 MG tablet; Take 1 tablet (15 mg total) by mouth at bedtime. Increase dose to 30mg  after 3 weeks if needed.  Dispense: 60 tablet; Refill: 2  2. Thyroid disease -Lab is pending.  Will make dose  adjustments as needed. - TSH  3. Irregular menstrual cycle - negative - POCT urine pregnancy   Marco Collie, PA-C  Primary Care at [facility] 12/11/2017 11:57 AM  Please note: Portions of this report may have been transcribed using dragon voice recognition software. Every effort was made to ensure accuracy; however, inadvertent computerized transcription errors may be present.

## 2017-12-12 ENCOUNTER — Encounter: Payer: Self-pay | Admitting: Physician Assistant

## 2017-12-12 ENCOUNTER — Other Ambulatory Visit: Payer: Self-pay | Admitting: Physician Assistant

## 2017-12-12 DIAGNOSIS — E039 Hypothyroidism, unspecified: Secondary | ICD-10-CM

## 2017-12-12 LAB — TSH: TSH: 0.119 u[IU]/mL — ABNORMAL LOW (ref 0.450–4.500)

## 2017-12-12 MED ORDER — LEVOTHYROXINE SODIUM 88 MCG PO TABS
88.0000 ug | ORAL_TABLET | Freq: Every day | ORAL | 3 refills | Status: DC
Start: 2017-12-12 — End: 2018-01-23

## 2017-12-12 NOTE — Progress Notes (Signed)
Low TSH.  Reduce Synthroid to 88 mcg daily.  Recheck in 2 to 3 months.

## 2017-12-20 ENCOUNTER — Telehealth: Payer: Self-pay | Admitting: Physician Assistant

## 2017-12-20 NOTE — Telephone Encounter (Signed)
Called pt to try and reschedule her appt with McVey on 01/15/18. McVey will not be available that day.   If pt. Calls back, please reschedule her at her convenience.  Thank you!

## 2018-01-15 ENCOUNTER — Ambulatory Visit: Payer: Self-pay | Admitting: Physician Assistant

## 2018-01-17 ENCOUNTER — Other Ambulatory Visit: Payer: Self-pay

## 2018-01-17 ENCOUNTER — Ambulatory Visit (INDEPENDENT_AMBULATORY_CARE_PROVIDER_SITE_OTHER): Payer: Self-pay | Admitting: Physician Assistant

## 2018-01-17 ENCOUNTER — Encounter: Payer: Self-pay | Admitting: Physician Assistant

## 2018-01-17 VITALS — BP 114/66 | HR 84 | Temp 98.9°F | Resp 16 | Ht 65.0 in | Wt 244.8 lb

## 2018-01-17 DIAGNOSIS — E039 Hypothyroidism, unspecified: Secondary | ICD-10-CM

## 2018-01-17 DIAGNOSIS — F329 Major depressive disorder, single episode, unspecified: Secondary | ICD-10-CM

## 2018-01-17 DIAGNOSIS — F32A Depression, unspecified: Secondary | ICD-10-CM

## 2018-01-17 MED ORDER — FLUOXETINE HCL 20 MG PO TABS: 20.0000 mg | ORAL_TABLET | Freq: Every day | ORAL | 3 refills | Status: DC

## 2018-01-17 NOTE — Patient Instructions (Addendum)
1) Once you are ready to start Prozac, you will stop Celexa -- do not miss a day of an SSRI medication as you may experience side effects.  Prozac start 20 mg once daily; may increase dose based on response and tolerability in 20 mg increments at intervals ?2-3 week up to a maximum dose of 80 mg/day. (Usual dose: 20 to 60 mg/day) Come back and see me in 4-6 weeks to check in regarding Prozac dose and response.   2)  I will contact you with the result of your thyroid test  3) No insurance for therapy:  [place] [phone] [address] [place], [place] you would like to try something different for therapy -- [facility] ([address] [place]) - [phone]  [address] [place]) - [place] - [phone] [facility] [place] - [phone] [place], [phone]  [facility]  - [phone]     Thank you for coming in today. I hope you feel we met your needs.  Feel free to call PCP if you have any questions or further requests.  Please consider signing up for MyChart if you do not already have it, as this is a great way to communicate with me.  Best,  Whitney McVey, PA-C  IF you received an x-ray today, you will receive an invoice from [facility]. Please contact [facility] at [phone] with questions or concerns regarding your invoice.   IF you received labwork today, you will receive an invoice from [place]. Please contact [facility] at [phone] with questions or concerns regarding your invoice.   Our billing staff will not be able to assist you with questions regarding bills from these companies.  You will be contacted with the lab results as soon as they are available. The  fastest way to get your results is to activate your My Chart account. Instructions are located on the last page of this paperwork. If you have not heard from [place] regarding the results in 2 weeks, please contact this office.

## 2018-01-17 NOTE — Progress Notes (Signed)
   Kim Lynch  MRN: [id number] DOB: 10-Apr-1991  PCP: Sebastian Ache, PA-C  Subjective:  Pt is a pleasant 27 year old female who presents to clinic for thyroid check.   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 Makes her feel hungry at 10 o'clock at night when she is trying to go to bed. She is sleeping well. She feels 30mg  works better than 15mg .  She is also taking Celexa 40mg .    Has tried: Lamictal before - didn't like the way it made her feel. Zoloft Celexa  Hypothyroid - Increased synthroid to after last labs 6/19 with TSH 15.3.  She does not miss doses. last TSH 7/31 was 0.119 and decreased Synthroid 88 mcg/day.  Review of Systems  Gastrointestinal: Negative for abdominal pain, nausea and vomiting.  Endocrine: Negative for cold intolerance and heat intolerance.  Psychiatric/Behavioral: Positive for dysphoric mood (controlled). Negative for self-injury, sleep disturbance and suicidal ideas. The patient is not nervous/anxious.     Patient Active Problem List   Diagnosis Date Noted  . Class 3 severe obesity with body mass index (BMI) of 40.0 to 44.9 in adult (HCC) 10/30/2017  . Depression 10/30/2017  . Hypothyroidism 10/30/2017    Current Outpatient Medications on File Prior to Visit  Medication Sig Dispense Refill  . citalopram (CELEXA) 40 MG tablet Take 1 tablet (40 mg total) by mouth daily. 30 tablet 5  . levothyroxine (SYNTHROID, LEVOTHROID) 88 MCG tablet Take 1 tablet (88 mcg total) by mouth daily. 90 tablet 3  . mirtazapine (REMERON) 15 MG tablet Take 1 tablet (15 mg total) by mouth at bedtime. Increase dose to 30mg  after 3 weeks if needed. 60 tablet 2   No current facility-administered medications on file prior to visit.     No Known Allergies   Objective:  There were no vitals taken for this visit.  Physical Exam  Constitutional: She is oriented to person,  place, and time. No distress.  Cardiovascular: Normal rate, regular rhythm and normal heart sounds.  Neurological: She is alert and oriented to person, place, and time.  Skin: Skin is warm and dry.  Psychiatric: Judgment normal.  Vitals reviewed.   Assessment and Plan :  1. Hypothyroidism, unspecified type - Pt here for recheck TSH level. We increased synthroid to after last labs 6/19 with TSH 15.3.  She does not miss doses. last TSH 7/31 was 0.119 and decreased Synthroid 88 mcg/day. Endorses medication compliance, denies medication SE. Will contact with results and plan.  - TSH  2. Depression, unspecified depression type - Controlled. con't current dose.  - FLUoxetine (PROZAC) 20 MG tablet; Take 1 tablet (20 mg total) by mouth daily. ; after 2 weeks, if needed, you may increase dose to 40mg /day  Dispense: 45 tablet; Refill: 3   Whitney Cheney Ewart, PA-C  Primary Care at [facility] 01/17/2018 2:21 PM  Please note: Portions of this report may have been transcribed using dragon voice recognition software. Every effort was made to ensure accuracy; however, inadvertent computerized transcription errors may be present.

## 2018-01-18 LAB — TSH: TSH: 0.332 u[IU]/mL — ABNORMAL LOW (ref 0.450–4.500)

## 2018-01-23 ENCOUNTER — Other Ambulatory Visit: Payer: Self-pay | Admitting: Physician Assistant

## 2018-01-23 DIAGNOSIS — E039 Hypothyroidism, unspecified: Secondary | ICD-10-CM

## 2018-01-23 MED ORDER — LEVOTHYROXINE SODIUM 75 MCG PO TABS: 75.0000 ug | ORAL_TABLET | Freq: Every day | ORAL | 3 refills | Status: DC

## 2018-01-23 NOTE — Progress Notes (Signed)
Reduce Synthroid to 5775mcg/day. Recheck with lab only visit in one month. Results released to mychart.

## 2018-02-24 ENCOUNTER — Encounter: Payer: Self-pay | Admitting: Physician Assistant

## 2018-02-24 ENCOUNTER — Telehealth: Payer: Self-pay

## 2018-02-24 NOTE — Telephone Encounter (Signed)
Pt MYCHART message - re: thyroid medication reduction, symptoms to make Whitney aware of before visit on Friday.    Message sent to [facility].

## 2018-02-28 ENCOUNTER — Ambulatory Visit: Payer: Self-pay | Admitting: Physician Assistant

## 2018-05-09 ENCOUNTER — Other Ambulatory Visit: Payer: Self-pay | Admitting: Physician Assistant

## 2018-05-09 DIAGNOSIS — F329 Major depressive disorder, single episode, unspecified: Secondary | ICD-10-CM

## 2018-05-09 DIAGNOSIS — F32A Depression, unspecified: Secondary | ICD-10-CM

## 2018-05-09 MED ORDER — CITALOPRAM HYDROBROMIDE 40 MG PO TABS: 40.0000 mg | ORAL_TABLET | Freq: Every day | ORAL | 0 refills | Status: DC

## 2018-05-09 NOTE — Telephone Encounter (Signed)
Copied from CRM [id number]. Topic: Quick Communication - Rx Refill/Question >> May 09, 2018  1:41 PM Jilda Rocheemaray, Melissa wrote: Medication: citalopram (CELEXA) 40 MG tablet     Has the patient contacted their pharmacy? No. (Agent: If no, request that the patient contact the pharmacy for the refill.) Patient has appt scheduled in February but will be running out of this medication next week (Agent: If yes, when and what did the pharmacy advise?)  Preferred Pharmacy (with phone number or street name):  [facility] [address]- [place], [place] - [id number] [phone] (Fax)   Agent: Please be advised that RX refills may take up to 3 business days. We ask that you follow-up with your pharmacy.

## 2018-05-09 NOTE — Telephone Encounter (Signed)
Requested Prescriptions  Pending Prescriptions Disp Refills  . citalopram (CELEXA) 40 MG tablet 90 tablet 0    Sig: Take 1 tablet (40 mg total) by mouth daily.     Psychiatry:  Antidepressants - SSRI Passed - 05/09/2018  3:02 PM      Passed - Completed PHQ-2 or PHQ-9 in the last 360 days.      Passed - Valid encounter within last 6 months    Recent Outpatient Visits          3 months ago Hypothyroidism, unspecified type   [facility], Madelaine BhatElizabeth Whitney, PA-C   4 months ago Depression, unspecified depression type   [facility], [place], PA-C   6 months ago Hypothyroidism, unspecified type   [facility], Madelaine BhatElizabeth Whitney, PA-C   3 years ago Non-intractable vomiting with nausea, vomiting of unspecified type   [facility], Harrel Lemonobert P, MD   5 years ago Sinusitis, acute   [facility], Kurt, MD      Future Appointments            In 1 month [name], Zoe A, MD [facility]

## 2018-06-24 ENCOUNTER — Encounter

## 2018-06-24 ENCOUNTER — Ambulatory Visit: Payer: Self-pay | Admitting: Family Medicine

## 2018-06-24 ENCOUNTER — Encounter: Payer: Self-pay | Admitting: Family Medicine

## 2018-06-24 ENCOUNTER — Other Ambulatory Visit: Payer: Self-pay

## 2018-06-24 VITALS — BP 115/81 | HR 85 | Temp 98.0°F | Resp 16 | Ht 65.0 in | Wt 236.0 lb

## 2018-06-24 DIAGNOSIS — E039 Hypothyroidism, unspecified: Secondary | ICD-10-CM

## 2018-06-24 DIAGNOSIS — E0580 Other thyrotoxicosis without thyrotoxic crisis or storm: Secondary | ICD-10-CM

## 2018-06-24 DIAGNOSIS — F419 Anxiety disorder, unspecified: Secondary | ICD-10-CM

## 2018-06-24 DIAGNOSIS — F32A Depression, unspecified: Secondary | ICD-10-CM

## 2018-06-24 DIAGNOSIS — R252 Cramp and spasm: Secondary | ICD-10-CM

## 2018-06-24 DIAGNOSIS — F329 Major depressive disorder, single episode, unspecified: Secondary | ICD-10-CM

## 2018-06-24 DIAGNOSIS — K625 Hemorrhage of anus and rectum: Secondary | ICD-10-CM

## 2018-06-24 LAB — POCT CBC
Granulocyte percent: 62.9 %G (ref 37–80)
HCT, POC: 39.4 % (ref 29–41)
Hemoglobin: 13.3 g/dL (ref 11–14.6)
LYMPH, POC: 2.1 (ref 0.6–3.4)
MCH, POC: 27.9 pg (ref 27–31.2)
MCHC: 33.7 g/dL (ref 31.8–35.4)
MCV: 82.7 fL (ref 76–111)
MID (cbc): 0.5 (ref 0–0.9)
MPV: 7 fL (ref 0–99.8)
POC Granulocyte: 4.5 (ref 2–6.9)
POC LYMPH PERCENT: 29.9 %L (ref 10–50)
POC MID %: 7.2 %M (ref 0–12)
Platelet Count, POC: 348 10*3/uL (ref 142–424)
RBC: 4.76 M/uL (ref 4.04–5.48)
RDW, POC: 15.4 %
WBC: 7.1 10*3/uL (ref 4.6–10.2)

## 2018-06-24 MED ORDER — CITALOPRAM HYDROBROMIDE 40 MG PO TABS: 40.0000 mg | ORAL_TABLET | Freq: Every day | ORAL | 1 refills | Status: DC

## 2018-06-24 NOTE — Progress Notes (Signed)
Established Patient Office Visit  Subjective:  Patient ID: Kim Lynch, female    DOB: 01/09/1991  Age: 28 y.o. MRN: [id number]  CC:  Chief Complaint  Patient presents with  . Hypothyroidism    follow-up/ medication refills  . Blood In Stools    x 1 month     HPI Kim Lynch presents for   Patient reports that she has a BM and then when she comes back to urinate later in the day she sees a light streak of blood wich is concerning She states that it  She denies straining There is no pain with BM  There is no rectal itching It does not soil her underwear No blood in the toilet She states that her BM looks normal to her without blood or black tarry color She denies rectal cancer No anal intercourse  She has 2-3 BM a day She is s/p lap cholecystectomy in 2011   H/o Hypothyroidism:  She recently was noted to have iatrogenic hyperthyroidism. Patient presents for evaluation of thyroid function. Symptoms consist of change in skin,  nails, or hair, muscle weakness and muscle fatigue. Her work consists of walking. Symptoms have present for 3 months. The symptoms are moderate.  The problem has been stable.  Previous thyroid studies include TSH. The hypothyroidism is due to hypothyroidism.  Lab Results  Component Value Date   TSH 0.332 (L) 01/17/2018   She reports that her dose was decreased from the last check Component     Latest Ref Rng & Units 10/30/2017 12/11/2017 01/17/2018  TSH     0.450 - 4.500 uIU/mL 15.300 (H) 0.119 (L) 0.332 (L)    Anxiety and Depression  [facility] 06/24/2018 01/17/2018 12/11/2017 10/30/2017  Decreased Interest 0 2 2 0  Down, Depressed, Hopeless 0 2 2 0  PHQ - 2 Score 0 4 4 0  Altered sleeping - 3 3 -  Tired, decreased energy - 2 1 -  Change in appetite - 2 3 -  Feeling bad or failure about yourself  - 1 2 -  Trouble concentrating - 1 1 -  Moving slowly or fidgety/restless - 3 2 -  Suicidal thoughts - 0 1 -  PHQ-9 Score - 16  17 -  Difficult doing work/chores - Somewhat difficult Somewhat difficult -   GAD 7 : Generalized Anxiety Score 06/24/2018  Nervous, Anxious, on Edge 1  Control/stop worrying 2  Worry too much - different things 2  Trouble relaxing 1  Restless 2  Easily annoyed or irritable 2  Afraid - awful might happen 0  Total GAD 7 Score 10  Anxiety Difficulty Somewhat difficult   She reports that she has anxiety and depression She takes that she take her celexa daily She denies side effects She does not do any counseling     Past Medical History:  Diagnosis Date  . ADHD (attention deficit hyperactivity disorder)   . Allergy   . Anemia   . Anxiety   . Asthma   . Depression   . Heart murmur   . Mood disorder (HCC)   . Thyroid disease     Past Surgical History:  Procedure Laterality Date  . GALLBLADDER SURGERY      Family History  Problem Relation Age of Onset  . Acute myelogenous leukemia Mother   . Depression Father   . Bipolar disorder Father   . Hyperlipidemia Father   . Anxiety disorder Brother   . Lupus Maternal Grandmother   .  Stroke Maternal Grandfather   . Heart disease Maternal Grandfather   . Lupus Paternal Grandmother     Social History   Socioeconomic History  . Marital status: Single    Spouse name: Not on file  . Number of children: Not on file  . Years of education: Not on file  . Highest education level: Not on file  Occupational History  . Not on file  Social Needs  . Financial resource strain: Not on file  . Food insecurity:    Worry: Not on file    Inability: Not on file  . Transportation needs:    Medical: Not on file    Non-medical: Not on file  Tobacco Use  . Smoking status: Former Games developer  . Smokeless tobacco: Never Used  Substance and Sexual Activity  . Alcohol use: No  . Drug use: No  . Sexual activity: Not on file  Lifestyle  . Physical activity:    Days per week: Not on file    Minutes per session: Not on file  . Stress: Not  on file  Relationships  . Social connections:    Talks on phone: Not on file    Gets together: Not on file    Attends religious service: Not on file    Active member of club or organization: Not on file    Attends meetings of clubs or organizations: Not on file    Relationship status: Not on file  . Intimate partner violence:    Fear of current or ex partner: Not on file    Emotionally abused: Not on file    Physically abused: Not on file    Forced sexual activity: Not on file  Other Topics Concern  . Not on file  Social History Narrative  . Not on file    Outpatient Medications Prior to Visit  Medication Sig Dispense Refill  . levothyroxine (SYNTHROID, LEVOTHROID) 75 MCG tablet Take 1 tablet (75 mcg total) by mouth daily. 30 tablet 3  . citalopram (CELEXA) 40 MG tablet Take 1 tablet (40 mg total) by mouth daily. 90 tablet 0  . FLUoxetine (PROZAC) 20 MG tablet Take 1 tablet (20 mg total) by mouth daily. ; after 2 weeks, if needed, you may increase dose to 40mg /day 45 tablet 3  . mirtazapine (REMERON) 15 MG tablet Take 1 tablet (15 mg total) by mouth at bedtime. Increase dose to 30mg  after 3 weeks if needed. 60 tablet 2   No facility-administered medications prior to visit.     No Known Allergies  ROS Review of Systems Review of Systems  Constitutional: Negative for activity change, appetite change, chills and fever.  HENT: Negative for congestion, nosebleeds, trouble swallowing and voice change.   Respiratory: Negative for cough, shortness of breath and wheezing.   Gastrointestinal: Negative for diarrhea, nausea and vomiting. See hpi Genitourinary: Negative for difficulty urinating, dysuria, flank pain and hematuria.  Musculoskeletal: Negative for back pain, joint swelling and neck pain.  Neurological: Negative for dizziness, speech difficulty, light-headedness and numbness.  See HPI. All other review of systems negative.     Objective:    Physical Exam  BP 115/81    Pulse 85   Temp 98 F (36.7 C) (Oral)   Resp 16   Ht 5\' 5"  (1.651 m)   Wt 236 lb (107 kg)   LMP 06/22/2018 (Exact Date)   SpO2 98%   BMI 39.27 kg/m  Wt Readings from Last 3 Encounters:  06/24/18 236 lb (107  kg)  01/17/18 244 lb 12.8 oz (111 kg)  12/11/17 229 lb (103.9 kg)   Physical Exam  Constitutional: Oriented to person, place, and time. Appears well-developed and well-nourished.  HENT:  Head: Normocephalic and atraumatic.  Eyes: Conjunctivae and EOM are normal.  Neck: no thyromegaly, supple Cardiovascular: Normal rate, regular rhythm, normal heart sounds and intact distal pulses.  No murmur heard. Pulmonary/Chest: Effort normal and breath sounds normal. No stridor. No respiratory distress. Has no wheezes.  Neurological: Is alert and oriented to person, place, and time. patellar tendon reflex 2+ Skin: Skin is warm. Capillary refill takes less than 2 seconds.  Psychiatric: Has a normal mood and affect. Behavior is normal. Judgment and thought content normal.    Health Maintenance Due  Topic Date Due  . HIV Screening  06/08/2005  . TETANUS/TDAP  06/08/2009  . PAP-Cervical Cytology Screening  06/09/2011  . PAP SMEAR-Modifier  06/09/2011    There are no preventive care reminders to display for this patient.  Lab Results  Component Value Date   TSH 0.332 (L) 01/17/2018   Lab Results  Component Value Date   WBC 7.1 06/24/2018   HGB 13.3 06/24/2018   HCT 39.4 06/24/2018   MCV 82.7 06/24/2018   PLT 481 (H) 09/17/2009   Lab Results  Component Value Date   NA 140 09/17/2009   K 4.8 09/17/2009   CO2 25 09/17/2009   GLUCOSE 96 09/17/2009   BUN 8 09/17/2009   CREATININE 0.64 09/17/2009   BILITOT 0.4 09/17/2009   ALKPHOS 78 09/17/2009   AST 22 09/17/2009   ALT 17 09/17/2009   PROT 8.0 09/17/2009   ALBUMIN 4.1 09/17/2009   CALCIUM 9.5 09/17/2009   No results found for: CHOL No results found for: HDL No results found for: LDLCALC No results found for: TRIG No  results found for: CHOLHDL No results found for: [id number]    Assessment & Plan:   Problem List Items Addressed This Visit      Endocrine   Hypothyroidism     Other   Depression  -     Relevant Medications   citalopram (CELEXA) 40 MG tablet    Other Visit Diagnoses    Rectal bleeding    -  Primary Will have patient return to clinic since she is currently having menstrual bleeding Plan for anoscopic exam in 2 weeks after period had ended    Relevant Orders   POCT CBC   Iatrogenic hyperthyroidism    - dose has been lowered May need to continue to lower dose Last check was 5 months ago   Relevant Orders   TSH   POCT CBC   Muscle cramps    - will evaluate for deficiencies   Relevant Orders   TSH   Basic metabolic panel   POCT CBC   Anxiety and depression    -  Patient stable on celexa Continue current dose  If she worsens will add buspar, patient is aware of this plan   Relevant Medications   citalopram (CELEXA) 40 MG tablet      Meds ordered this encounter  Medications  . citalopram (CELEXA) 40 MG tablet    Sig: Take 1 tablet (40 mg total) by mouth daily.    Dispense:  90 tablet    Refill:  1    Follow-up: Return in about 2 weeks (around 07/08/2018) for rectal bleeding .    Doristine BosworthZoe A Abygale Karpf, MD

## 2018-06-24 NOTE — Patient Instructions (Addendum)
     If you have lab work done today you will be contacted with your lab results within the next 2 weeks.  If you have not heard from [place] then please contact us. The fastest way to get your results is to register for My Chart.   IF you received an x-ray today, you will receive an invoice from [facility]. Please contact [facility] at [phone] with questions or concerns regarding your invoice.   IF you received labwork today, you will receive an invoice from [place]. Please contact [facility] at [phone] with questions or concerns regarding your invoice.   Our billing staff will not be able to assist you with questions regarding bills from these companies.  You will be contacted with the lab results as soon as they are available. The fastest way to get your results is to activate your My Chart account. Instructions are located on the last page of this paperwork. If you have not heard from [place] regarding the results in 2 weeks, please contact this office.      Rectal Bleeding  Rectal bleeding is when blood passes out of the anus. People with rectal bleeding may notice bright red blood in their underwear or in the toilet after having a bowel movement. They may also have dark red or black stools. Rectal bleeding is usually a sign that something is wrong. Many things can cause rectal bleeding, including:  Hemorrhoids. These are blood vessels in the anus or rectum that are larger than normal.  Fistulas. These are abnormal passages in the rectum and anus.  Anal fissures. This is a tear in the anus.  Diverticulosis. This is a condition in which pockets or sacs project from the bowel.  Proctitis and colitis. These are conditions in which the rectum, colon, or anus become inflamed.  Polyps. These are growths that can be cancerous (malignant) or non-cancerous (benign).  Part of the rectum sticking out from the anus (rectal prolapse).  Certain medicines.  Intestinal  infections. Follow these instructions at home: Pay attention to any changes in your symptoms. Take these actions to help lessen bleeding and discomfort:  Eat a diet that is high in fiber. This will keep your stool soft, making it easier to pass stools without straining. Ask your health care provider what foods and drinks are high in fiber.  Drink enough fluid to keep your urine clear or pale yellow. This also helps to keep your stool soft.  Try taking a warm bath. This may help soothe any pain in your rectum.  Keep all follow-up visits as told by your health care provider. This is important. Get help right away if:  You have new or increased rectal bleeding.  You have black or dark red stools.  You vomit blood or something that looks like coffee grounds.  You have pain or tenderness in your abdomen.  You have a fever.  You feel weak.  You feel nauseous.  You faint.  You have severe pain in your rectum.  You cannot have a bowel movement. This information is not intended to replace advice given to you by your health care provider. Make sure you discuss any questions you have with your health care provider. Document Released: 10/20/2001 Document Revised: 10/06/2015 Document Reviewed: 06/26/2015 Elsevier Interactive Patient Education  2019 [employer].

## 2018-06-25 LAB — TSH: TSH: 12.36 u[IU]/mL — ABNORMAL HIGH (ref 0.450–4.500)

## 2018-06-25 LAB — BASIC METABOLIC PANEL
BUN/Creatinine Ratio: 14 (ref 9–23)
BUN: 10 mg/dL (ref 6–20)
CHLORIDE: 103 mmol/L (ref 96–106)
CO2: 22 mmol/L (ref 20–29)
Calcium: 9.5 mg/dL (ref 8.7–10.2)
Creatinine, Ser: 0.72 mg/dL (ref 0.57–1.00)
GFR calc Af Amer: 132 mL/min/{1.73_m2} (ref >59)
GFR calc non Af Amer: 114 mL/min/{1.73_m2} (ref >59)
Glucose: 98 mg/dL (ref 65–99)
POTASSIUM: 4.7 mmol/L (ref 3.5–5.2)
Sodium: 139 mmol/L (ref 134–144)

## 2018-06-26 ENCOUNTER — Telehealth: Payer: Self-pay | Admitting: Family Medicine

## 2018-06-26 MED ORDER — LEVOTHYROXINE SODIUM 88 MCG PO TABS: 88.0000 ug | ORAL_TABLET | Freq: Every day | ORAL | 0 refills | Status: DC

## 2018-06-26 NOTE — Addendum Note (Signed)
Addended by: Collie SiadSTALLINGS, Nishi Neiswonger A on: 06/26/2018 04:13 PM   Modules accepted: Orders

## 2018-06-26 NOTE — Telephone Encounter (Signed)
Left message for patient to check her mychart.  If she calls she should be notified that her thyroid levels is now 12 which means her thyroid dose is too low when it was previously too high.  Her last levothyroxine was which was too high and is too low. I will send in to the pharmacy. She should return in 3 months to recheck her levels.

## 2018-07-01 NOTE — Telephone Encounter (Signed)
Tried to call pt no answer if pt call back please relay message about medication change.

## 2018-07-08 ENCOUNTER — Ambulatory Visit: Payer: Self-pay | Admitting: Family Medicine

## 2018-10-01 ENCOUNTER — Other Ambulatory Visit: Payer: Self-pay | Admitting: Family Medicine

## 2018-10-01 DIAGNOSIS — E039 Hypothyroidism, unspecified: Secondary | ICD-10-CM

## 2018-10-02 MED ORDER — LEVOTHYROXINE SODIUM 88 MCG PO TABS: 88.0000 ug | ORAL_TABLET | Freq: Every day | ORAL | 0 refills | Status: DC

## 2018-10-22 ENCOUNTER — Other Ambulatory Visit: Payer: Self-pay | Admitting: Pediatric Intensive Care

## 2018-10-22 DIAGNOSIS — Z20822 Contact with and (suspected) exposure to covid-19: Secondary | ICD-10-CM

## 2018-10-23 LAB — NOVEL CORONAVIRUS, NAA: SARS-CoV-2, NAA: NOT DETECTED

## 2018-12-26 ENCOUNTER — Other Ambulatory Visit: Payer: Self-pay | Admitting: Family Medicine

## 2018-12-26 DIAGNOSIS — E039 Hypothyroidism, unspecified: Secondary | ICD-10-CM

## 2018-12-26 DIAGNOSIS — E0580 Other thyrotoxicosis without thyrotoxic crisis or storm: Secondary | ICD-10-CM

## 2018-12-28 ENCOUNTER — Other Ambulatory Visit: Payer: Self-pay

## 2018-12-28 DIAGNOSIS — E039 Hypothyroidism, unspecified: Secondary | ICD-10-CM

## 2018-12-28 NOTE — Telephone Encounter (Signed)
Denied levothyroxine 88 mcg last filled 10/02/18 and last ov 06/24/2018 for rectal bleeding.  Will place future orders for TSH and will send to scheduling pool to call pt for ov f/u thyroid and med refill. Dgaddy, CMA

## 2019-01-04 ENCOUNTER — Other Ambulatory Visit: Payer: Self-pay | Admitting: Family Medicine

## 2019-01-04 DIAGNOSIS — E039 Hypothyroidism, unspecified: Secondary | ICD-10-CM

## 2019-01-05 NOTE — Telephone Encounter (Signed)
Requested medication (s) are due for refill today: yes  Requested medication (s) are on the active medication list: yes  Last refill:  10/02/2018  Future visit scheduled: no  Notes to clinic:  Left vm for patient to call and schedule office visit    Requested Prescriptions  Pending Prescriptions Disp Refills   levothyroxine (SYNTHROID) 88 MCG tablet [Pharmacy Med Name: Levothyroxine Sodium 88 MCG Oral Tablet] 90 tablet 0    Sig: Take 1 tablet by mouth once daily     Endocrinology:  Hypothyroid Agents Failed - 01/04/2019  4:10 PM      Failed - TSH needs to be rechecked within 3 months after an abnormal result. Refill until TSH is due.      Failed - TSH in normal range and within 360 days    TSH  Date Value Ref Range Status  06/24/2018 12.360 (H) 0.450 - 4.500 uIU/mL Final         Passed - Valid encounter within last 12 months    Recent Outpatient Visits          6 months ago Rectal bleeding   Primary Care at [facility], Arlie Solomons, MD   11 months ago Hypothyroidism, unspecified type   Primary Care at [facility], Gelene Mink, PA-C   1 year ago Depression, unspecified depression type   Primary Care at [facility], Gelene Mink, PA-C   1 year ago Hypothyroidism, unspecified type   Primary Care at [facility], Gelene Mink, PA-C   4 years ago Non-intractable vomiting with nausea, vomiting of unspecified type   Primary Care at [facility], Linton Ham, MD

## 2019-01-08 ENCOUNTER — Ambulatory Visit (INDEPENDENT_AMBULATORY_CARE_PROVIDER_SITE_OTHER): Payer: BC Managed Care – PPO | Admitting: Family Medicine

## 2019-01-08 ENCOUNTER — Encounter: Payer: Self-pay | Admitting: Family Medicine

## 2019-01-08 ENCOUNTER — Other Ambulatory Visit: Payer: Self-pay

## 2019-01-08 DIAGNOSIS — F329 Major depressive disorder, single episode, unspecified: Secondary | ICD-10-CM | POA: Diagnosis not present

## 2019-01-08 DIAGNOSIS — F32A Depression, unspecified: Secondary | ICD-10-CM

## 2019-01-08 DIAGNOSIS — E039 Hypothyroidism, unspecified: Secondary | ICD-10-CM | POA: Diagnosis not present

## 2019-01-08 MED ORDER — CITALOPRAM HYDROBROMIDE 40 MG PO TABS: 40.0000 mg | ORAL_TABLET | Freq: Every day | ORAL | 1 refills | Status: DC

## 2019-01-08 MED ORDER — LEVOTHYROXINE SODIUM 88 MCG PO TABS: 88.0000 ug | ORAL_TABLET | Freq: Every day | ORAL | 1 refills | Status: DC

## 2019-01-08 NOTE — Progress Notes (Signed)
Patient ID: Kim Lynch, female    DOB: 1991-05-14  Age: 28 y.o. MRN: [id number]  Chief Complaint  Patient presents with  . Medication Refill    levothyroxine    Subjective:   28 year old patient of Dr. Creta LevinStallings who comes in for her thyroid check.  She has been out of her medications for about 4 days.  She takes the Synthroid on a routine basis every morning at the same time, before eating other things.  She also takes Celexa for depression and some anxiety.  She has been well on that.  She has noted a little tender area in the right side of her neck, and more that checked.  She otherwise feels good.  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  She does some other exercise by walking her dog.  She lives with her dad.  Last menstrual cycle was 3 weeks ago.  Current allergies, medications, problem list, past/family and social histories reviewed.  Objective:  BP 128/79 (BP Location: Right Arm, Patient Position: Sitting, Cuff Size: Large)   Pulse (!) 54   Temp 97.9 F (36.6 C) (Oral)   Resp 18   Ht 5\' 5"  (1.651 m)   Wt 223 lb (101.2 kg)   SpO2 99%   BMI 37.11 kg/m   No major acute distress.  Pleasant young lady.  Alert and oriented.  Neck supple, mild tenderness just lateral to the voicebox region.  Thyroid is not enlarged and no nodules noted.  Chest clear and heart regular.  Assessment & Plan:   Assessment: 1. Acquired hypothyroidism   2. Depression, unspecified depression type       Plan: We will continue her same medications.  However since she has been off of the Synthroid for a few days, if the level is not correct we will just have her repeat it in about 6 or 8 weeks.  Discussed with her further she needs to be seeing an endocrinologist, which she was asking about because she now has insurance.  I advised her that routine hypothyroidism usually could just be managed by adjusting the doses by her  primary care physician.  If she decides to still wish to see an endocrine specialist referral can be made.  Patient declined flu shot.  Orders Placed This Encounter  Procedures  . TSH    Meds ordered this encounter  Medications  . levothyroxine (SYNTHROID) 88 MCG tablet    Sig: Take 1 tablet (88 mcg total) by mouth daily.    Dispense:  90 tablet    Refill:  1  . citalopram (CELEXA) 40 MG tablet    Sig: Take 1 tablet (40 mg total) by mouth daily.    Dispense:  90 tablet    Refill:  1         Patient Instructions  Continue taking the levothyroxine 88 mcg 1 daily.  We will get back to you the results of your laboratory test.  In the event that the thyroid is out of line, we may need you to return in about 6 or 8 weeks to repeat it since you have been off the pills for a few days currently.  Continue taking the citalopram 1 daily.  Refills for 90-day quantity with 1 refill each were submitted.  Return to see Dr. Creta LevinStallings in 6 months prior to the expiration date of your medicines.  If the area of tenderness in  the neck persists or a nodule is growing larger there please return sooner.    Return in about 6 months (around 07/11/2019), or Dr Nolon Rod.   Ruben Reason, MD 01/08/2019

## 2019-01-08 NOTE — Patient Instructions (Signed)
Continue taking the levothyroxine 88 mcg 1 daily.  We will get back to you the results of your laboratory test.  In the event that the thyroid is out of line, we may need you to return in about 6 or 8 weeks to repeat it since you have been off the pills for a few days currently.  Continue taking the citalopram 1 daily.  Refills for 90-day quantity with 1 refill each were submitted.  Return to see Dr. Nolon Rod in 6 months prior to the expiration date of your medicines.  If the area of tenderness in the neck persists or a nodule is growing larger there please return sooner.

## 2019-01-09 LAB — TSH: TSH: 7.77 u[IU]/mL — ABNORMAL HIGH (ref 0.450–4.500)

## 2019-01-26 ENCOUNTER — Encounter: Payer: Self-pay | Admitting: Radiology

## 2019-03-13 ENCOUNTER — Ambulatory Visit
Admission: RE | Admit: 2019-03-13 | Discharge: 2019-03-13 | Disposition: A | Discharge status: Home / Self Care | Payer: BC Managed Care – PPO | Source: Ambulatory Visit | Attending: Family Medicine | Admitting: Family Medicine

## 2019-03-13 ENCOUNTER — Ambulatory Visit (INDEPENDENT_AMBULATORY_CARE_PROVIDER_SITE_OTHER): Payer: BC Managed Care – PPO | Admitting: Family Medicine

## 2019-03-13 ENCOUNTER — Encounter: Payer: Self-pay | Admitting: Family Medicine

## 2019-03-13 ENCOUNTER — Other Ambulatory Visit: Payer: Self-pay

## 2019-03-13 VITALS — BP 132/80 | HR 62 | Temp 98.0°F | Resp 12 | Ht 65.0 in | Wt 221.0 lb

## 2019-03-13 DIAGNOSIS — R109 Unspecified abdominal pain: Secondary | ICD-10-CM

## 2019-03-13 DIAGNOSIS — R1012 Left upper quadrant pain: Secondary | ICD-10-CM | POA: Diagnosis not present

## 2019-03-13 DIAGNOSIS — Z23 Encounter for immunization: Secondary | ICD-10-CM | POA: Diagnosis not present

## 2019-03-13 NOTE — Progress Notes (Signed)
Established Patient Office Visit  Subjective:  Patient ID: Kim Lynch, female    DOB: 1990/07/27  Age: 28 y.o. MRN: [id number]  CC:  Chief Complaint  Patient presents with   Abdominal Pain    Pt stated LUQ--sharp pain, sore to touch, nausea, swollen---4 weeks. Denied fever.    HPI Ader Witters presents for   One month history of LUQ  S/p lap choles 2011 She had an EGD that showed scar tissue  Now the pain is like someone pushing in on the stomach  Past Medical History:  Diagnosis Date   ADHD (attention deficit hyperactivity disorder)    Allergy    Anemia    Anxiety    Asthma    Depression    Heart murmur    Mood disorder (HCC)    Thyroid disease     Past Surgical History:  Procedure Laterality Date   GALLBLADDER SURGERY      Family History  Problem Relation Age of Onset   Acute myelogenous leukemia Mother    Depression Father    Bipolar disorder Father    Hyperlipidemia Father    Anxiety disorder Brother    Lupus Maternal Grandmother    Stroke Maternal Grandfather    Heart disease Maternal Grandfather    Lupus Paternal Grandmother     Social History   Socioeconomic History   Marital status: Single    Spouse name: Not on file   Number of children: Not on file   Years of education: Not on file   Highest education level: Not on file  Occupational History   Not on file  Social Needs   Financial resource strain: Not on file   Food insecurity    Worry: Not on file    Inability: Not on file   Transportation needs    Medical: Not on file    Non-medical: Not on file  Tobacco Use   Smoking status: Former Smoker   Smokeless tobacco: Never Used  Substance and Sexual Activity   Alcohol use: No   Drug use: No   Sexual activity: Not on file  Lifestyle   Physical activity    Days per week: Not on file    Minutes per session: Not on file   Stress: Not on file  Relationships   Social connections    Talks  on phone: Not on file    Gets together: Not on file    Attends religious service: Not on file    Active member of club or organization: Not on file    Attends meetings of clubs or organizations: Not on file    Relationship status: Not on file   Intimate partner violence    Fear of current or ex partner: Not on file    Emotionally abused: Not on file    Physically abused: Not on file    Forced sexual activity: Not on file  Other Topics Concern   Not on file  Social History Narrative   Not on file    Outpatient Medications Prior to Visit  Medication Sig Dispense Refill   citalopram (CELEXA) 40 MG tablet Take 1 tablet (40 mg total) by mouth daily. 90 tablet 1   levothyroxine (SYNTHROID) 88 MCG tablet Take 1 tablet (88 mcg total) by mouth daily. 90 tablet 1   No facility-administered medications prior to visit.     No Known Allergies  ROS Review of Systems  Constitutional: Negative for chills, fatigue and fever.  HENT: Negative for  congestion.   Respiratory: Negative for cough, choking and chest tightness.   Cardiovascular: Negative for chest pain and leg swelling.  Gastrointestinal: Positive for abdominal pain. Negative for blood in stool, constipation and diarrhea.  Endocrine: Negative for cold intolerance, heat intolerance and polydipsia.  Genitourinary: Negative for dysuria, enuresis and flank pain.      Objective:    BP 132/80    Pulse 62    Temp 98 F (36.7 C)    Resp 12    Ht 5\' 5"  (1.651 m)    Wt 221 lb (100.2 kg)    LMP 03/04/2019    SpO2 98%    BMI 36.78 kg/m   Wt Readings from Last 3 Encounters:  03/13/19 221 lb (100.2 kg)  01/08/19 223 lb (101.2 kg)  06/24/18 236 lb (107 kg)    Physical Exam  Constitutional: She is oriented to person, place, and time. She appears well-nourished.  HENT:  Head: Normocephalic and atraumatic.  Eyes: Conjunctivae and EOM are normal.  Cardiovascular: Regular rhythm and normal heart sounds.  Pulmonary/Chest: Effort normal  and breath sounds normal. No respiratory distress. She has no wheezes. She has no rales.  Neurological: She is oriented to person, place, and time.  Skin: Skin is warm. No rash noted.  Psychiatric: She has a normal mood and affect. Her behavior is normal. Judgment and thought content normal.     Normoactive bs in all quadrants Abdomen non-distended and soft Tender to palpation of the LUQ and epigastrium Noted to also be tender to percussion No rebound, no guarding   Health Maintenance Due  Topic Date Due   HIV Screening  06/08/2005   TETANUS/TDAP  06/08/2009   PAP-Cervical Cytology Screening  06/09/2011   PAP SMEAR-Modifier  06/09/2011    There are no preventive care reminders to display for this patient.  Lab Results  Component Value Date   TSH 7.770 (H) 01/08/2019   Lab Results  Component Value Date   WBC 7.1 06/24/2018   HGB 13.3 06/24/2018   HCT 39.4 06/24/2018   MCV 82.7 06/24/2018   PLT 481 (H) 09/17/2009   Lab Results  Component Value Date   NA 139 06/24/2018   K 4.7 06/24/2018   CO2 22 06/24/2018   GLUCOSE 98 06/24/2018   BUN 10 06/24/2018   CREATININE 0.72 06/24/2018   BILITOT 0.4 09/17/2009   ALKPHOS 78 09/17/2009   AST 22 09/17/2009   ALT 17 09/17/2009   PROT 8.0 09/17/2009   ALBUMIN 4.1 09/17/2009   CALCIUM 9.5 06/24/2018      Assessment & Plan:   Problem List Items Addressed This Visit    None    Visit Diagnoses    LUQ abdominal pain    -  Primary   Relevant Orders   DG Abd 1 View   Visceral abdominal pain         Will evaluate to see if there is air under the diaphragm although pt has no pleuritic pain Likely gas distention causing visceral pain Advised KUB-which is normal advised simethicone and metamucil  Continue hydration and ambulation   Go to ER if there is recurrent episodes of vomiting   No orders of the defined types were placed in this encounter.   Follow-up: No follow-ups on file.    08/23/2018, MD

## 2019-03-13 NOTE — Patient Instructions (Addendum)
Please call for a plain film xray [facility]  [phone]      If you have lab work done today you will be contacted with your lab results within the next 2 weeks.  If you have not heard from [place] then please contact us. The fastest way to get your results is to register for My Chart.   IF you received an x-ray today, you will receive an invoice from [facility]. Please contact [facility] at [phone] with questions or concerns regarding your invoice.   IF you received labwork today, you will receive an invoice from [facility]. Please contact [facility] at [phone] with questions or concerns regarding your invoice.   Our billing staff will not be able to assist you with questions regarding bills from these companies.  You will be contacted with the lab results as soon as they are available. The fastest way to get your results is to activate your My Chart account. Instructions are located on the last page of this paperwork. If you have not heard from [place] regarding the results in 2 weeks, please contact this office.     Simethicone chewable tablets What is this medicine? SIMETHICONE (sye METH i kone) is used to decrease the discomfort caused by gas. This medicine may be used for other purposes; ask your health care provider or pharmacist if you have questions. COMMON BRAND NAME(S): Gas Relief, Gas-X, Gas-X Extra Strength, Gas-X with Maalox, Genasyme, Maalox Max, Maalox Max Plus [place], Mylanta Gas, Mytab Gas, Phazyme, Titralac Plus What should I tell my health care provider before I take this medicine? They need to know if you have any of these conditions:  phenylketonuria  an unusual or allergic reaction to simethicone, other medicines, foods, dyes, or preservatives  pregnant or trying to get pregnant  breast-feeding How should I use this medicine? Take this medicine by mouth. Crush or chew the tablets. Do not swallow them whole. Follow the  directions on the label or those given to you by your doctor or health care professional. Do not take your medicine more often than directed. Talk to your pediatrician regarding the use of this medicine in children. While this medicine may be used in children as young as 12 years for selected conditions, precautions do apply. Overdosage: If you think you have taken too much of this medicine contact a poison control center or emergency room at once. NOTE: This medicine is only for you. Do not share this medicine with others. What if I miss a dose? This does not apply. You will only use this medicine as needed for gas pain. Do not use double or extra doses. What may interact with this medicine? Interactions are not expected. This list may not describe all possible interactions. Give your health care provider a list of all the medicines, herbs, non-prescription drugs, or dietary supplements you use. Also tell them if you smoke, drink alcohol, or use illegal drugs. Some items may interact with your medicine. What should I watch for while using this medicine? Tell your doctor or health care professional if your symptoms get worse, or if you have severe pain, diarrhea, constipation, or blood in your stool. These could be signs of a more serious condition. What side effects may I notice from receiving this medicine? There are no reported side effects of this medicine. This list may not describe all possible side effects. Call your doctor for medical advice about side effects. You may report side effects to FDA at 1-800-FDA-1088.  Where should I keep my medicine? Keep out of the reach of children. Store at room temperature between 15 and 30 degrees C (59 and 86 degrees F). Keep container tightly closed. Throw away any unused medicine after the expiration date. NOTE: This sheet is a summary. It may not cover all possible information. If you have questions about this medicine, talk to your doctor, pharmacist,  or health care provider.  2020 Elsevier/Gold Standard (2007-12-31 15:45:11)

## 2019-03-30 ENCOUNTER — Telehealth: Payer: Self-pay | Admitting: Family Medicine

## 2019-03-30 NOTE — Telephone Encounter (Signed)
LVM to r/s appt from 07/09/2019 with Dr. Nolon Rod. Provider will be out of the office on that day

## 2019-04-13 ENCOUNTER — Telehealth (INDEPENDENT_AMBULATORY_CARE_PROVIDER_SITE_OTHER): Payer: BC Managed Care – PPO | Admitting: Family Medicine

## 2019-04-13 ENCOUNTER — Other Ambulatory Visit: Payer: Self-pay

## 2019-04-13 DIAGNOSIS — R52 Pain, unspecified: Secondary | ICD-10-CM

## 2019-04-13 DIAGNOSIS — Z7189 Other specified counseling: Secondary | ICD-10-CM

## 2019-04-13 DIAGNOSIS — J029 Acute pharyngitis, unspecified: Secondary | ICD-10-CM

## 2019-04-13 NOTE — Progress Notes (Signed)
Cc: note for note stating she is in isolation for pending covid 19 test.  No travel outside the [place] or [place] in the last 3 weeks, no other concerns or refills needed.

## 2019-04-13 NOTE — Patient Instructions (Signed)
° ° ° °  If you have lab work done today you will be contacted with your lab results within the next 2 weeks.  If you have not heard from us then please contact us. The fastest way to get your results is to register for My Chart. ° ° °IF you received an x-ray today, you will receive an invoice from [facility]. Please contact [facility] at [phone] with questions or concerns regarding your invoice.  ° °IF you received labwork today, you will receive an invoice from [facility]. Please contact [facility] at [phone] with questions or concerns regarding your invoice.  ° °Our billing staff will not be able to assist you with questions regarding bills from these companies. ° °You will be contacted with the lab results as soon as they are available. The fastest way to get your results is to activate your My Chart account. Instructions are located on the last page of this paperwork. If you have not heard from us regarding the results in 2 weeks, please contact this office. °  ° ° ° °

## 2019-04-13 NOTE — Progress Notes (Signed)
Doximity Video Encounter- SOAP NOTE Established Patient  This video encounter was conducted with the patient's (or proxy's) verbal consent via audio telecommunications: yes/no: Yes Patient was instructed to have this encounter in a suitably private space; and to only have persons present to whom they give permission to participate. In addition, patient identity was confirmed by use of name plus two identifiers (DOB and address).  I discussed the limitations, risks, security and privacy concerns of performing an evaluation and management service by telephone and the availability of in person appointments. I also discussed with the patient that there may be a patient responsible charge related to this service. The patient expressed understanding and agreed to proceed.  I spent a total of TIME; 0 MIN TO 60 MIN: 15 minutes talking with the patient or their proxy.  Chief Complaint  Patient presents with  . work note stating she is in isolation for covid    Subjective   Kim Lynch is a 28 y.o. established patient. Doximity video visit today for  HPI  2-3 days of sore throat, congestion, itchy watery eyes, skin warm to the touch A few cold chills No nausea Reports fatigue Her test was done on 04/12/2019 at work She was advised that her results would be 04/15/2019 She works in [employer] in [place] She has not had a flu shot She checked her throat and there are no red spots or pus just the pain She is taking sudafed and ibuprofen She is gargling with warm salt water and drinking hot tea and lemon  Patient Active Problem List   Diagnosis Date Noted  . Class 3 severe obesity with body mass index (BMI) of 40.0 to 44.9 in adult ([place]) 10/30/2017  . Depression 10/30/2017  . Hypothyroidism 10/30/2017    Past Medical History:  Diagnosis Date  . ADHD (attention deficit hyperactivity disorder)   . Allergy   . Anemia   . Anxiety   . Asthma   . Depression   . Heart murmur   .  Mood disorder ([place])   . Thyroid disease     Current Outpatient Medications  Medication Sig Dispense Refill  . citalopram (CELEXA) 40 MG tablet Take 1 tablet (40 mg total) by mouth daily. 90 tablet 1  . levothyroxine (SYNTHROID) 88 MCG tablet Take 1 tablet (88 mcg total) by mouth daily. 90 tablet 1   No current facility-administered medications for this visit.     No Known Allergies  Social History   Socioeconomic History  . Marital status: Single    Spouse name: Not on file  . Number of children: Not on file  . Years of education: Not on file  . Highest education level: Not on file  Occupational History  . Not on file  Social Needs  . Financial resource strain: Not on file  . Food insecurity    Worry: Not on file    Inability: Not on file  . Transportation needs    Medical: Not on file    Non-medical: Not on file  Tobacco Use  . Smoking status: Former Research scientist (life sciences)  . Smokeless tobacco: Never Used  Substance and Sexual Activity  . Alcohol use: No  . Drug use: No  . Sexual activity: Not on file  Lifestyle  . Physical activity    Days per week: Not on file    Minutes per session: Not on file  . Stress: Not on file  Relationships  . Social Herbalist on  phone: Not on file    Gets together: Not on file    Attends religious service: Not on file    Active member of club or organization: Not on file    Attends meetings of clubs or organizations: Not on file    Relationship status: Not on file  . Intimate partner violence    Fear of current or ex partner: Not on file    Emotionally abused: Not on file    Physically abused: Not on file    Forced sexual activity: Not on file  Other Topics Concern  . Not on file  Social History Narrative  . Not on file    ROS See hpi  Objective   Vitals as reported by the patient: There were no vitals filed for this visit.  Using Doximity video  Physical Exam  Constitutional: Oriented to person, place, and time.  Appears well-developed and well-nourished.  HENT:  Head: Normocephalic and atraumatic.  Eyes: Conjunctivae and EOM are normal.  Cardiovascular: unable to perform Pulmonary/Chest: Effort normal. No respiratory distress.   Psychiatric: Has a normal mood and affect. Behavior is normal. Judgment and thought content normal.     Hennesy was seen today for work note stating she is in isolation for covid.  Diagnoses and all orders for this visit:  Sore throat  Advice given about COVID-19 virus infection  Body aches   Advised pt to remain home from work and remain in quarantine for 10 days from onset of symptoms   If covid is negative and her symptoms are unresolved she should present for evaluation for strep as well  She verbalizes understanding Discussed home care    I discussed the assessment and treatment plan with the patient. The patient was provided an opportunity to ask questions and all were answered. The patient agreed with the plan and demonstrated an understanding of the instructions.   The patient was advised to call back or seek an in-person evaluation if the symptoms worsen or if the condition fails to improve as anticipated.  I provided 15 minutes of face-to-face time during this encounter using doximity video.  Doristine Bosworth, MD  Primary Care at [facility]

## 2019-07-09 ENCOUNTER — Ambulatory Visit: Payer: BC Managed Care – PPO | Admitting: Family Medicine

## 2019-07-13 ENCOUNTER — Telehealth (INDEPENDENT_AMBULATORY_CARE_PROVIDER_SITE_OTHER): Payer: BC Managed Care – PPO | Admitting: Family Medicine

## 2019-07-13 DIAGNOSIS — Z5329 Procedure and treatment not carried out because of patient's decision for other reasons: Secondary | ICD-10-CM

## 2019-07-13 NOTE — Progress Notes (Signed)
Recheck on chronic medical conditions

## 2019-07-14 ENCOUNTER — Encounter: Payer: Self-pay | Admitting: Family Medicine

## 2019-07-14 ENCOUNTER — Encounter: Payer: Self-pay | Admitting: Registered Nurse

## 2019-07-14 ENCOUNTER — Other Ambulatory Visit: Payer: Self-pay

## 2019-07-14 ENCOUNTER — Telehealth (INDEPENDENT_AMBULATORY_CARE_PROVIDER_SITE_OTHER): Payer: BC Managed Care – PPO | Admitting: Registered Nurse

## 2019-07-14 DIAGNOSIS — F32A Depression, unspecified: Secondary | ICD-10-CM

## 2019-07-14 DIAGNOSIS — E039 Hypothyroidism, unspecified: Secondary | ICD-10-CM | POA: Diagnosis not present

## 2019-07-14 DIAGNOSIS — F329 Major depressive disorder, single episode, unspecified: Secondary | ICD-10-CM

## 2019-07-14 MED ORDER — CITALOPRAM HYDROBROMIDE 40 MG PO TABS: 40.0000 mg | ORAL_TABLET | Freq: Every day | ORAL | 1 refills | Status: AC

## 2019-07-14 MED ORDER — LEVOTHYROXINE SODIUM 88 MCG PO TABS: 88.0000 ug | ORAL_TABLET | Freq: Every day | ORAL | 0 refills | Status: DC

## 2019-07-14 NOTE — Patient Instructions (Signed)
° ° ° °  If you have lab work done today you will be contacted with your lab results within the next 2 weeks.  If you have not heard from us then please contact us. The fastest way to get your results is to register for My Chart. ° ° °IF you received an x-ray today, you will receive an invoice from [facility]. Please contact [facility] at [phone] with questions or concerns regarding your invoice.  ° °IF you received labwork today, you will receive an invoice from [facility]. Please contact [facility] at [phone] with questions or concerns regarding your invoice.  ° °Our billing staff will not be able to assist you with questions regarding bills from these companies. ° °You will be contacted with the lab results as soon as they are available. The fastest way to get your results is to activate your My Chart account. Instructions are located on the last page of this paperwork. If you have not heard from us regarding the results in 2 weeks, please contact this office. °  ° ° ° °

## 2019-07-14 NOTE — Progress Notes (Signed)
Telemedicine Encounter- SOAP NOTE Established Patient  This telephone encounter was conducted with the patient's (or proxy's) verbal consent via audio telecommunications: yes  Patient was instructed to have this encounter in a suitably private space; and to only have persons present to whom they give permission to participate. In addition, patient identity was confirmed by use of name plus two identifiers (DOB and address).  I discussed the limitations, risks, security and privacy concerns of performing an evaluation and management service by telephone and the availability of in person appointments. I also discussed with the patient that there may be a patient responsible charge related to this service. The patient expressed understanding and agreed to proceed.  I spent a total of 14 minutes talking with the patient or their proxy.  Chief Complaint  Patient presents with  . Medication Refill    Subjective   Cecilia Rawl is a 29 y.o. established patient. Telephone visit today for medication refills  HPI Citalopram: 40mg  PO qd. Has been taking this for around 10 years with good effect. No concerns with the dosage at this time but some concern about the length of time that she has been taking this - reassured her that this is a safe and effective medication. We agreed that we would wait to make any changes until her thyroid is stable.  Levothyroxine: currently taking PO qam. Good effect. Still feels sleepy more often than she would like and feels like she has a hard time rising in the am. Last TSH was still elevated. She is due to have this checked again - order placed, she will present for lab only visit.   Otherwise she is feeling well overall with no further concerns.   Patient Active Problem List   Diagnosis Date Noted  . Class 3 severe obesity with body mass index (BMI) of 40.0 to 44.9 in adult (HCC) 10/30/2017  . Depression 10/30/2017  . Hypothyroidism 10/30/2017     Past Medical History:  Diagnosis Date  . ADHD (attention deficit hyperactivity disorder)   . Allergy   . Anemia   . Anxiety   . Asthma   . Depression   . Heart murmur   . Mood disorder (HCC)   . Thyroid disease     Current Outpatient Medications  Medication Sig Dispense Refill  . citalopram (CELEXA) 40 MG tablet Take 1 tablet (40 mg total) by mouth daily. 90 tablet 1  . levothyroxine (SYNTHROID) 88 MCG tablet Take 1 tablet (88 mcg total) by mouth daily. 30 tablet 0   No current facility-administered medications for this visit.    No Known Allergies  Social History   Socioeconomic History  . Marital status: Single    Spouse name: Not on file  . Number of children: Not on file  . Years of education: Not on file  . Highest education level: Not on file  Occupational History  . Not on file  Tobacco Use  . Smoking status: Former 11/01/2017  . Smokeless tobacco: Never Used  Substance and Sexual Activity  . Alcohol use: No  . Drug use: No  . Sexual activity: Not on file  Other Topics Concern  . Not on file  Social History Narrative  . Not on file   Social Determinants of Health   Financial Resource Strain:   . Difficulty of Paying Living Expenses: Not on file  Food Insecurity:   . Worried About Games developer in the Last Year: Not on file  .  Ran Out of Food in the Last Year: Not on file  Transportation Needs:   . Lack of Transportation (Medical): Not on file  . Lack of Transportation (Non-Medical): Not on file  Physical Activity:   . Days of Exercise per Week: Not on file  . Minutes of Exercise per Session: Not on file  Stress:   . Feeling of Stress : Not on file  Social Connections:   . Frequency of Communication with Friends and Family: Not on file  . Frequency of Social Gatherings with Friends and Family: Not on file  . Attends Religious Services: Not on file  . Active Member of Clubs or Organizations: Not on file  . Attends Archivist  Meetings: Not on file  . Marital Status: Not on file  Intimate Partner Violence:   . Fear of Current or Ex-Partner: Not on file  . Emotionally Abused: Not on file  . Physically Abused: Not on file  . Sexually Abused: Not on file    Review of Systems  Constitutional: Positive for malaise/fatigue. Negative for chills, diaphoresis, fever and weight loss.  HENT: Negative.   Eyes: Negative.   Respiratory: Negative.   Cardiovascular: Negative.   Gastrointestinal: Negative.   Genitourinary: Negative.   Musculoskeletal: Negative.   Skin: Negative.   Neurological: Negative.   Endo/Heme/Allergies: Negative.   Psychiatric/Behavioral: Negative.   All other systems reviewed and are negative.   Objective   Vitals as reported by the patient: Today's Vitals   07/14/19 1350  Temp: (!) 97.3 F (36.3 C)  TempSrc: Oral  Weight: 210 lb (95.3 kg)    Lilyrose was seen today for medication refill.  Diagnoses and all orders for this visit:  Depression, unspecified depression type -     citalopram (CELEXA) 40 MG tablet; Take 1 tablet (40 mg total) by mouth daily.  Acquired hypothyroidism -     levothyroxine (SYNTHROID) 88 MCG tablet; Take 1 tablet (88 mcg total) by mouth daily.   PLAN  Refill citalopram x 6 mo  Refill levothyroxine for 30 days, will refill or titrate dose based on TSH  Return in 3-6 mos for follow up with PCP Dr. Nolon Rod  Patient encouraged to call clinic with any questions, comments, or concerns.   I discussed the assessment and treatment plan with the patient. The patient was provided an opportunity to ask questions and all were answered. The patient agreed with the plan and demonstrated an understanding of the instructions.   The patient was advised to call back or seek an in-person evaluation if the symptoms worsen or if the condition fails to improve as anticipated.  I provided 14 minutes of non-face-to-face time during this encounter.  Maximiano Coss,  NP  Primary Care at [facility]

## 2019-07-14 NOTE — Progress Notes (Signed)
Patient is calling for a prescription refill on both of her medications no other concerns

## 2019-08-18 ENCOUNTER — Encounter: Payer: Self-pay | Admitting: Registered Nurse

## 2019-08-19 ENCOUNTER — Other Ambulatory Visit: Payer: Self-pay

## 2019-08-19 ENCOUNTER — Ambulatory Visit: Payer: Self-pay

## 2019-08-19 DIAGNOSIS — E039 Hypothyroidism, unspecified: Secondary | ICD-10-CM

## 2019-08-20 ENCOUNTER — Other Ambulatory Visit: Payer: Self-pay | Admitting: Registered Nurse

## 2019-08-20 ENCOUNTER — Other Ambulatory Visit: Payer: Self-pay

## 2019-08-20 DIAGNOSIS — E039 Hypothyroidism, unspecified: Secondary | ICD-10-CM

## 2019-08-20 LAB — TSH: TSH: 7.24 u[IU]/mL — ABNORMAL HIGH (ref 0.450–4.500)

## 2019-08-20 MED ORDER — LEVOTHYROXINE SODIUM 88 MCG PO TABS: 88.0000 ug | ORAL_TABLET | Freq: Every day | ORAL | 0 refills | Status: DC

## 2019-08-26 ENCOUNTER — Encounter: Payer: Self-pay | Admitting: Registered Nurse

## 2019-08-26 ENCOUNTER — Other Ambulatory Visit: Payer: Self-pay | Admitting: Registered Nurse

## 2019-08-26 DIAGNOSIS — E039 Hypothyroidism, unspecified: Secondary | ICD-10-CM

## 2019-08-26 MED ORDER — LEVOTHYROXINE SODIUM 100 MCG PO TABS: 100.0000 ug | ORAL_TABLET | Freq: Every day | ORAL | 0 refills | Status: AC

## 2020-01-14 ENCOUNTER — Other Ambulatory Visit: Payer: Self-pay | Admitting: Registered Nurse

## 2020-01-14 DIAGNOSIS — E039 Hypothyroidism, unspecified: Secondary | ICD-10-CM

## 2020-01-15 NOTE — Telephone Encounter (Signed)
Please call pt and see if she is able to make a f/u appointment in order to refill medications.

## 2020-01-15 NOTE — Telephone Encounter (Signed)
01/15/2020 - PATIENT REQUESTING A REFILL ON HER EUTHYROX  88 MCG ORAL TABLET. I TRIED TO CALL AND SCHEDULE AN OFFICE VISIT WITH RICH MORROW BUT I HAD TO LEAVE A MESSAGE ON HER VOICE MAIL TO RETURN MY CALL. I WILL NOT ROUTE THIS MESSAGE BACK TO THE CLINICAL TEAM UNTIL SHE CALLS BACK TO SCHEDULE. MBC

## 2021-04-11 IMAGING — DX DG ABDOMEN 1V
2 series · 2 of 2 positions shown · non-contrast
Comparison: None.

CLINICAL DATA: Upper abdominal pain, primarily left-sided

EXAM:
ABDOMEN - 1 VIEW

[dg abd 1 view (1 of 2)]
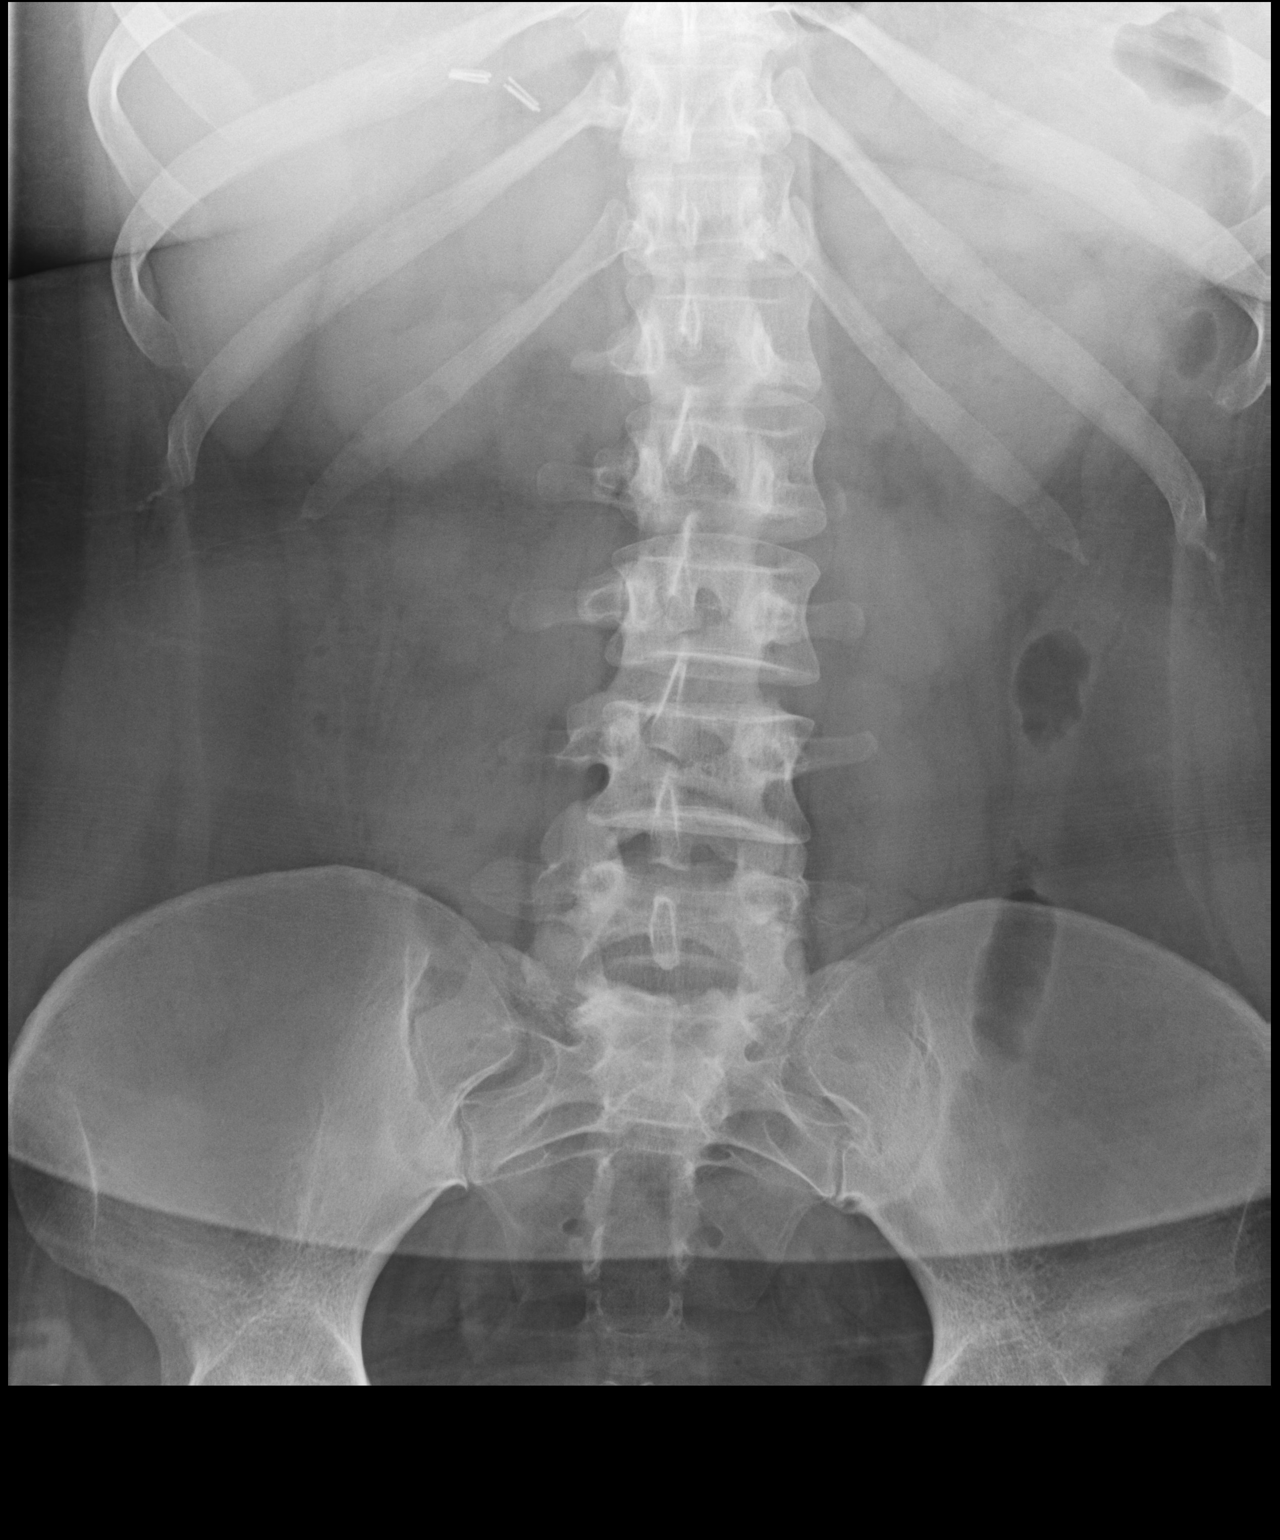

[dg abd 1 view (2 of 2)]
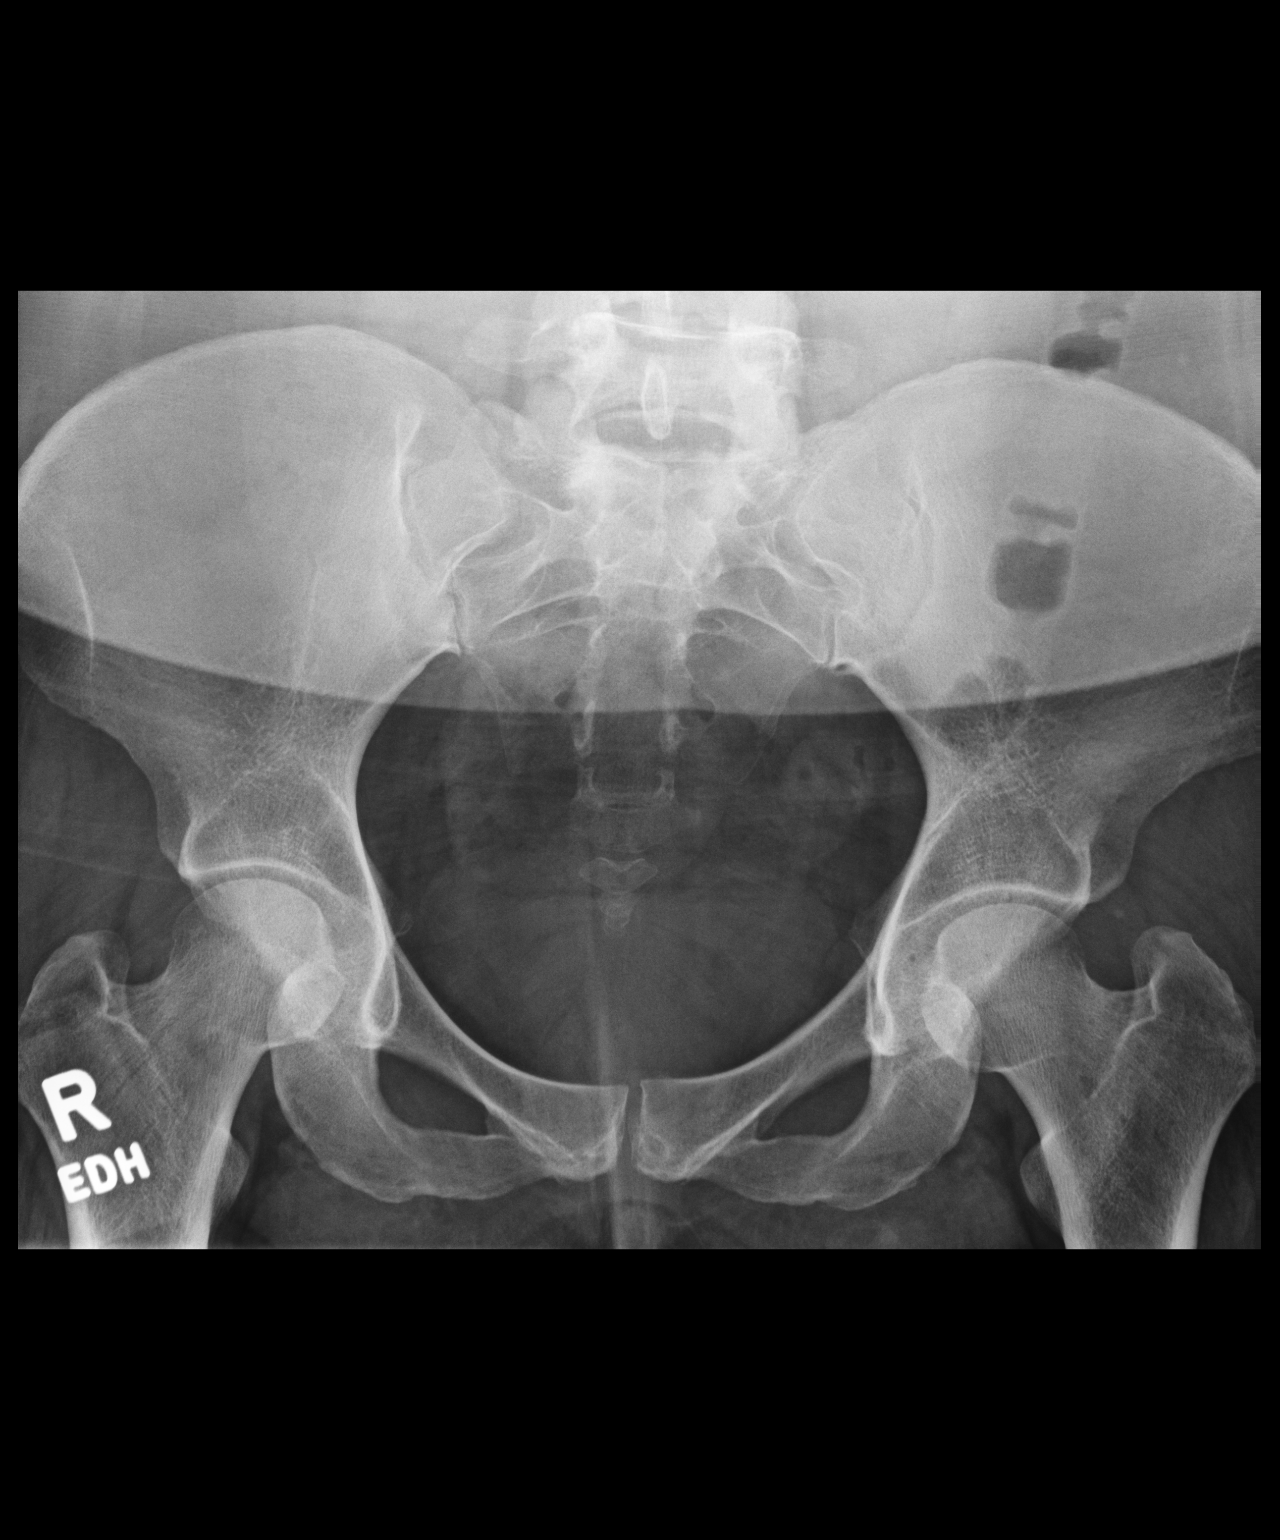

[2 of 2 positions shown; findings below may reference images not displayed]

FINDINGS: There is moderate stool in the colon. There is no appreciable bowel
dilatation or air-fluid level to suggest bowel obstruction. No free
air. No abnormal calcifications. There are surgical clips in the
right upper quadrant region.
IMPRESSION: Surgical clips in right upper quadrant. No bowel obstruction or free
air. Moderate stool in colon.
